# Patient Record
Sex: Female | Born: 1970
Health system: Southern US, Community
[De-identification: ages and names within clinical notes are randomized; demographics above are authoritative.]

## PROBLEM LIST (undated history)

## (undated) DIAGNOSIS — F32A Depression, unspecified: Secondary | ICD-10-CM

## (undated) DIAGNOSIS — F419 Anxiety disorder, unspecified: Secondary | ICD-10-CM

## (undated) DIAGNOSIS — F329 Major depressive disorder, single episode, unspecified: Secondary | ICD-10-CM

## (undated) DIAGNOSIS — R5382 Chronic fatigue, unspecified: Secondary | ICD-10-CM

## (undated) DIAGNOSIS — J3089 Other allergic rhinitis: Secondary | ICD-10-CM

## (undated) DIAGNOSIS — D573 Sickle-cell trait: Secondary | ICD-10-CM

## (undated) DIAGNOSIS — E162 Hypoglycemia, unspecified: Secondary | ICD-10-CM

## (undated) DIAGNOSIS — J45909 Unspecified asthma, uncomplicated: Secondary | ICD-10-CM

## (undated) DIAGNOSIS — J449 Chronic obstructive pulmonary disease, unspecified: Secondary | ICD-10-CM

## (undated) DIAGNOSIS — D649 Anemia, unspecified: Secondary | ICD-10-CM

## (undated) DIAGNOSIS — M1990 Unspecified osteoarthritis, unspecified site: Secondary | ICD-10-CM

## (undated) HISTORY — PX: SHOULDER ARTHROSCOPY: SHX128

## (undated) HISTORY — PX: COLONOSCOPY: SHX174

## (undated) HISTORY — PX: KNEE ARTHROSCOPY: SUR90

## (undated) HISTORY — PX: WRIST GANGLION EXCISION: SUR520

## (undated) HISTORY — PX: ORTHOPEDIC SURGERY: SHX850

---
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
## 1992-07-12 HISTORY — PX: TUBAL LIGATION: SHX77

## 2000-02-17 HISTORY — PX: CHOLECYSTECTOMY: SHX55

## 2013-07-12 HISTORY — PX: ABDOMINAL HYSTERECTOMY: SHX81

## 2017-12-22 DIAGNOSIS — S93692A Other sprain of left foot, initial encounter: Secondary | ICD-10-CM | POA: Diagnosis not present

## 2018-05-10 ENCOUNTER — Ambulatory Visit: Payer: Self-pay | Admitting: Family Medicine

## 2018-06-01 ENCOUNTER — Emergency Department (HOSPITAL_COMMUNITY): Payer: Managed Care, Other (non HMO)

## 2018-06-01 ENCOUNTER — Emergency Department (HOSPITAL_COMMUNITY)
Admission: EM | Admit: 2018-06-01 | Discharge: 2018-06-02 | Disposition: A | Discharge status: Home / Self Care | Payer: Managed Care, Other (non HMO) | Attending: Emergency Medicine | Admitting: Emergency Medicine

## 2018-06-01 DIAGNOSIS — S4992XA Unspecified injury of left shoulder and upper arm, initial encounter: Secondary | ICD-10-CM | POA: Diagnosis present

## 2018-06-01 DIAGNOSIS — Y929 Unspecified place or not applicable: Secondary | ICD-10-CM | POA: Diagnosis not present

## 2018-06-01 DIAGNOSIS — Y9389 Activity, other specified: Secondary | ICD-10-CM | POA: Insufficient documentation

## 2018-06-01 DIAGNOSIS — S80212A Abrasion, left knee, initial encounter: Secondary | ICD-10-CM | POA: Diagnosis not present

## 2018-06-01 DIAGNOSIS — S43102A Unspecified dislocation of left acromioclavicular joint, initial encounter: Secondary | ICD-10-CM | POA: Diagnosis not present

## 2018-06-01 DIAGNOSIS — S43402A Unspecified sprain of left shoulder joint, initial encounter: Secondary | ICD-10-CM | POA: Diagnosis not present

## 2018-06-01 DIAGNOSIS — Z79899 Other long term (current) drug therapy: Secondary | ICD-10-CM | POA: Insufficient documentation

## 2018-06-01 DIAGNOSIS — Y999 Unspecified external cause status: Secondary | ICD-10-CM | POA: Insufficient documentation

## 2018-06-01 DIAGNOSIS — S8992XA Unspecified injury of left lower leg, initial encounter: Secondary | ICD-10-CM | POA: Diagnosis not present

## 2018-06-01 DIAGNOSIS — T1490XA Injury, unspecified, initial encounter: Secondary | ICD-10-CM

## 2018-06-01 DIAGNOSIS — M549 Dorsalgia, unspecified: Secondary | ICD-10-CM | POA: Diagnosis not present

## 2018-06-01 MED ORDER — FENTANYL CITRATE (PF) 100 MCG/2ML IJ SOLN
50.0000 ug | Freq: Once | INTRAMUSCULAR | Status: AC
  Administered 2018-06-01: 50 ug via INTRAVENOUS

## 2018-06-01 MED ORDER — FENTANYL CITRATE (PF) 100 MCG/2ML IJ SOLN
INTRAMUSCULAR | Status: AC
  Filled 2018-06-01: qty 2

## 2018-06-01 NOTE — ED Notes (Signed)
Patient transported to CT 
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
## 2018-06-01 NOTE — Progress Notes (Signed)
Orthopedic Tech Progress Note Patient Details:  Chelsea Morse 10-Feb-1971 [id number]  Ortho Devices Type of Ortho Device: Arm sling Ortho Device/Splint Interventions: Ordered, Application   Post Interventions Patient Tolerated: Well Instructions Provided: Care of device, Adjustment of device   [name] T 06/01/2018, 11:54 PM
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
## 2018-06-01 NOTE — ED Notes (Signed)
Xray bedside.
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
## 2018-06-02 NOTE — ED Notes (Signed)
Provider back in room to discuss discharge questions.

## 2018-06-02 NOTE — ED Notes (Signed)
Discharge instructions reviewed by both RN and provider.  All questions answered, no other concerns at this time.  Pt stated "I can't sign." referring to discharge instructions.  Wanted to walk rather than ride in wheelchair out to car.

## 2018-06-16 ENCOUNTER — Encounter (HOSPITAL_COMMUNITY): Payer: Self-pay

## 2018-06-16 ENCOUNTER — Ambulatory Visit (HOSPITAL_COMMUNITY)
Admission: EM | Admit: 2018-06-16 | Discharge: 2018-06-16 | Disposition: A | Discharge status: Home / Self Care | Payer: Managed Care, Other (non HMO) | Attending: Family Medicine | Admitting: Family Medicine

## 2018-06-16 DIAGNOSIS — S43005A Unspecified dislocation of left shoulder joint, initial encounter: Secondary | ICD-10-CM | POA: Diagnosis not present

## 2018-06-16 HISTORY — DX: Hypoglycemia, unspecified: E16.2

## 2018-06-16 HISTORY — DX: Chronic obstructive pulmonary disease, unspecified: J44.9

## 2018-06-16 MED ORDER — OXYCODONE-ACETAMINOPHEN 5-325 MG PO TABS: 1.0000 | ORAL_TABLET | Freq: Every evening | ORAL | 0 refills | Status: DC | PRN
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
## 2018-06-16 NOTE — Discharge Instructions (Signed)
Follow up with [facility]

## 2018-07-28 ENCOUNTER — Encounter (HOSPITAL_COMMUNITY): Payer: Self-pay | Admitting: *Deleted

## 2018-07-28 ENCOUNTER — Emergency Department (HOSPITAL_COMMUNITY)
Admission: EM | Admit: 2018-07-28 | Discharge: 2018-07-28 | Disposition: A | Discharge status: Home / Self Care | Payer: Medicare Other | Attending: Emergency Medicine | Admitting: Emergency Medicine

## 2018-07-28 ENCOUNTER — Emergency Department (HOSPITAL_COMMUNITY): Payer: Medicare Other

## 2018-07-28 DIAGNOSIS — B9789 Other viral agents as the cause of diseases classified elsewhere: Secondary | ICD-10-CM | POA: Diagnosis not present

## 2018-07-28 DIAGNOSIS — J449 Chronic obstructive pulmonary disease, unspecified: Secondary | ICD-10-CM | POA: Diagnosis not present

## 2018-07-28 DIAGNOSIS — J069 Acute upper respiratory infection, unspecified: Secondary | ICD-10-CM | POA: Diagnosis not present

## 2018-07-28 DIAGNOSIS — Z79899 Other long term (current) drug therapy: Secondary | ICD-10-CM | POA: Diagnosis not present

## 2018-07-28 DIAGNOSIS — F17200 Nicotine dependence, unspecified, uncomplicated: Secondary | ICD-10-CM | POA: Diagnosis not present

## 2018-07-28 DIAGNOSIS — R05 Cough: Secondary | ICD-10-CM | POA: Diagnosis not present

## 2018-07-28 MED ORDER — OSELTAMIVIR PHOSPHATE 75 MG PO CAPS: 75.0000 mg | ORAL_CAPSULE | Freq: Two times a day (BID) | ORAL | 0 refills | Status: DC

## 2018-07-28 NOTE — ED Triage Notes (Signed)
Chest congestion for 3 days with soreness in chest and back when taking a deep breath.  Pt states that she has had a cough and would like a CXR to see if she has pneumonia

## 2018-07-28 NOTE — ED Notes (Signed)
Patient given discharge instructions and verbalized understanding.  Patient stable to discharge at this time.  Patient is alert and oriented to baseline.  No distressed noted at this time.  All belongings taken with the patient at discharge.   

## 2018-07-28 NOTE — Discharge Instructions (Addendum)
Return for any problem.  Follow-up with your regular care providers as instructed. 

## 2018-07-28 NOTE — ED Provider Notes (Signed)
[facility] EMERGENCY DEPARTMENT Provider Note   CSN: [id number] Arrival date & time: 07/28/18  1247     History   Chief Complaint Chief Complaint  Patient presents with  . URI    HPI Chelsea Morse is a 48 y.o. female.  48 year old female with prior medical history as detailed below presents for evaluation of cough, congestion, myalgia, subjective fever.  Symptoms started 4 days ago.  Patient requested an x-ray to rule out possible pneumonia.  Patient was no other acute complaint.  Patient currently denies shortness of breath, vomiting, or other acute complaint.  The history is provided by the patient and medical records.  URI  Presenting symptoms: congestion and cough   Severity:  Mild Onset quality:  Sudden Duration:  4 days Timing:  Constant Progression:  Waxing and waning Chronicity:  New Relieved by:  Nothing Worsened by:  Nothing Ineffective treatments:  None tried   Past Medical History:  Diagnosis Date  . COPD (chronic obstructive pulmonary disease) ([place])   . Hypoglycemia     There are no active problems to display for this patient.   Past Surgical History:  Procedure Laterality Date  . ORTHOPEDIC SURGERY       OB History   No obstetric history on file.      Home Medications    Prior to Admission medications   Medication Sig Start Date End Date Taking? Authorizing Provider  albuterol (PROVENTIL) (2.5 MG/3ML) 0.083% nebulizer solution Take 2.5 mg by nebulization every 6 (six) hours as needed for wheezing or shortness of breath.    [provider]  albuterol (VENTOLIN HFA) 108 (90 Base) MCG/ACT inhaler Inhale 2 puffs into the lungs every 4 (four) hours as needed for wheezing or shortness of breath.    [provider]  budesonide-formoterol (SYMBICORT) 160-4.5 MCG/ACT inhaler Inhale 2 puffs into the lungs 2 (two) times daily.    [provider]  fluticasone (FLONASE) 50 MCG/ACT nasal spray Place 1 spray  into both nostrils daily.    [provider]  lamoTRIgine (LAMICTAL) 25 MG tablet Take 25 mg by mouth 2 (two) times daily.     [provider]  Menthol-Camphor (ICY HOT ADVANCED RELIEF) 16-11 % CREA Apply 1 application topically as needed (for back pain).    [provider]  montelukast (SINGULAIR) 10 MG tablet Take 10 mg by mouth at bedtime.    [provider]  naproxen sodium (ALEVE) 220 MG tablet Take 220-440 mg by mouth 2 (two) times daily as needed (for back pain).     [provider]  oseltamivir (TAMIFLU) 75 MG capsule Take 1 capsule (75 mg total) by mouth every 12 (twelve) hours. 07/28/18   Chelsea Merino, MD  oxyCODONE-acetaminophen (PERCOCET/ROXICET) 5-325 MG tablet Take 1 tablet by mouth at bedtime as needed and may repeat dose one time if needed for severe pain. 06/16/18   Robyn Haber, MD  QUEtiapine (SEROQUEL) 50 MG tablet Take 50 mg by mouth at bedtime.    [provider]    Family History Family History  Problem Relation Age of Onset  . Healthy Mother   . Healthy Father     Social History Social History   Tobacco Use  . Smoking status: Current Some Day Smoker  . Smokeless tobacco: Never Used  Substance Use Topics  . Alcohol use: Never    Frequency: Never  . Drug use: Never     Allergies   Asa [aspirin]   Review  of Systems Review of Systems  HENT: Positive for congestion.   Respiratory: Positive for cough.   All other systems reviewed and are negative.    Physical Exam Updated Vital Signs BP (!) 146/98 (BP Location: Right Arm)   Pulse 72   Temp 98.5 F (36.9 C) (Oral)   Resp 16   Ht 5\' 7"  (1.702 m)   Wt 65.8 kg   SpO2 100%   BMI 22.71 kg/m   Physical Exam Vitals signs and nursing note reviewed.  Constitutional:      General: She is not in acute distress.    Appearance: She is well-developed.  HENT:     Head: Normocephalic and atraumatic.  Eyes:     Conjunctiva/sclera: Conjunctivae  normal.     Pupils: Pupils are equal, round, and reactive to light.  Neck:     Musculoskeletal: Normal range of motion and neck supple.  Cardiovascular:     Rate and Rhythm: Normal rate and regular rhythm.     Heart sounds: Normal heart sounds.  Pulmonary:     Effort: Pulmonary effort is normal. No respiratory distress.     Breath sounds: Normal breath sounds.     Comments: Lungs clear bilaterally with auscultation.  No appreciable wheezes. Abdominal:     General: There is no distension.     Palpations: Abdomen is soft.     Tenderness: There is no abdominal tenderness.  Musculoskeletal: Normal range of motion.        General: No deformity.  Skin:    General: Skin is warm and dry.  Neurological:     Mental Status: She is alert and oriented to person, place, and time.      ED Treatments / Results  Labs (all labs ordered are listed, but only abnormal results are displayed) Labs Reviewed - No data to display  EKG None  Radiology Dg Chest 2 View  Result Date: 07/28/2018 CLINICAL DATA:  Cough and congestion EXAM: CHEST - 2 VIEW COMPARISON:  June 01, 2018 FINDINGS: Lungs are clear. Heart size and pulmonary vascularity are normal. No adenopathy. No bone lesions. IMPRESSION: No edema or consolidation. Electronically Signed   By: Lowella Grip III M.D.   On: 07/28/2018 13:13    Procedures Procedures (including critical care time)  Medications Ordered in ED Medications - No data to display   Initial Impression / Assessment and Plan / ED Course  I have reviewed the triage vital signs and the nursing notes.  Pertinent labs & imaging results that were available during my care of the patient were reviewed by me and considered in my medical decision making (see chart for details).     MDM  Screen complete  Patient is presenting for evaluation of URI symptoms.  This is most likely viral.  Patient with comorbidities including COPD.  Chest x-ray does not reveal acute  infiltrate or pneumonia.  Patient without evidence of current bronchospasm.  Patient offered Tamiflu given her comorbidities and the high likelihood of influenza infection.  She is doubtful that she will fill the prescription.  Importance of close follow-up was stressed.  Strict return cautions given and understood.  Final Clinical Impressions(s) / ED Diagnoses   Final diagnoses:  Viral upper respiratory tract infection    ED Discharge Orders         Ordered    oseltamivir (TAMIFLU) 75 MG capsule  Every 12 hours     07/28/18 1347  Chelsea Merino, MD 07/28/18 1353

## 2018-08-12 ENCOUNTER — Emergency Department: Payer: Medicare Other

## 2018-08-12 ENCOUNTER — Emergency Department
Admission: EM | Admit: 2018-08-12 | Discharge: 2018-08-12 | Disposition: A | Discharge status: Home / Self Care | Payer: Medicare Other | Attending: Emergency Medicine | Admitting: Emergency Medicine

## 2018-08-12 ENCOUNTER — Other Ambulatory Visit: Payer: Self-pay

## 2018-08-12 ENCOUNTER — Encounter: Payer: Self-pay | Admitting: Emergency Medicine

## 2018-08-12 DIAGNOSIS — K5900 Constipation, unspecified: Secondary | ICD-10-CM | POA: Insufficient documentation

## 2018-08-12 DIAGNOSIS — F17200 Nicotine dependence, unspecified, uncomplicated: Secondary | ICD-10-CM | POA: Diagnosis not present

## 2018-08-12 DIAGNOSIS — R110 Nausea: Secondary | ICD-10-CM | POA: Insufficient documentation

## 2018-08-12 DIAGNOSIS — N390 Urinary tract infection, site not specified: Secondary | ICD-10-CM | POA: Diagnosis not present

## 2018-08-12 DIAGNOSIS — Z79899 Other long term (current) drug therapy: Secondary | ICD-10-CM | POA: Insufficient documentation

## 2018-08-12 DIAGNOSIS — R1012 Left upper quadrant pain: Secondary | ICD-10-CM | POA: Insufficient documentation

## 2018-08-12 DIAGNOSIS — J449 Chronic obstructive pulmonary disease, unspecified: Secondary | ICD-10-CM | POA: Insufficient documentation

## 2018-08-12 LAB — CBC
HEMATOCRIT: 37.9 % (ref 36.0–46.0)
HEMOGLOBIN: 13.1 g/dL (ref 12.0–15.0)
MCH: 26.8 pg (ref 26.0–34.0)
MCHC: 34.6 g/dL (ref 30.0–36.0)
MCV: 77.5 fL — AB (ref 80.0–100.0)
Platelets: 333 10*3/uL (ref 150–400)
RBC: 4.89 MIL/uL (ref 3.87–5.11)
RDW: 13 % (ref 11.5–15.5)
WBC: 9 10*3/uL (ref 4.0–10.5)
nRBC: 0 % (ref 0.0–0.2)

## 2018-08-12 LAB — URINALYSIS, COMPLETE (UACMP) WITH MICROSCOPIC
Bilirubin Urine: NEGATIVE
Glucose, UA: NEGATIVE mg/dL
Ketones, ur: NEGATIVE mg/dL
Leukocytes, UA: NEGATIVE
Nitrite: POSITIVE — AB
PROTEIN: NEGATIVE mg/dL
SPECIFIC GRAVITY, URINE: 1.013 (ref 1.005–1.030)
pH: 7 (ref 5.0–8.0)

## 2018-08-12 LAB — COMPREHENSIVE METABOLIC PANEL
ALBUMIN: 4.6 g/dL (ref 3.5–5.0)
ALT: 12 U/L (ref 0–44)
AST: 16 U/L (ref 15–41)
Alkaline Phosphatase: 69 U/L (ref 38–126)
Anion gap: 6 (ref 5–15)
BILIRUBIN TOTAL: 0.7 mg/dL (ref 0.3–1.2)
BUN: 8 mg/dL (ref 6–20)
CHLORIDE: 105 mmol/L (ref 98–111)
CO2: 26 mmol/L (ref 22–32)
CREATININE: 0.63 mg/dL (ref 0.44–1.00)
Calcium: 9.2 mg/dL (ref 8.9–10.3)
GFR calc Af Amer: >60 mL/min (ref >60)
GFR calc non Af Amer: >60 mL/min (ref >60)
GLUCOSE: 89 mg/dL (ref 70–99)
POTASSIUM: 4.4 mmol/L (ref 3.5–5.1)
Sodium: 137 mmol/L (ref 135–145)
Total Protein: 7.9 g/dL (ref 6.5–8.1)

## 2018-08-12 LAB — LIPASE, BLOOD: LIPASE: 20 U/L (ref 11–51)

## 2018-08-12 MED ORDER — PANTOPRAZOLE SODIUM 40 MG PO TBEC: 40.0000 mg | DELAYED_RELEASE_TABLET | Freq: Every day | ORAL | 0 refills | Status: DC

## 2018-08-12 MED ORDER — OXYCODONE-ACETAMINOPHEN 5-325 MG PO TABS: 1.0000 | ORAL_TABLET | Freq: Four times a day (QID) | ORAL | 0 refills | Status: DC | PRN

## 2018-08-12 MED ORDER — ONDANSETRON HCL 4 MG/2ML IJ SOLN
4.0000 mg | Freq: Once | INTRAMUSCULAR | Status: AC
  Administered 2018-08-12: 4 mg via INTRAVENOUS
  Filled 2018-08-12: qty 2

## 2018-08-12 MED ORDER — MORPHINE SULFATE (PF) 4 MG/ML IV SOLN
4.0000 mg | Freq: Once | INTRAVENOUS | Status: AC
  Administered 2018-08-12: 4 mg via INTRAVENOUS
  Filled 2018-08-12: qty 1

## 2018-08-12 MED ORDER — SODIUM CHLORIDE 0.9% FLUSH: 3.0000 mL | Freq: Once | INTRAVENOUS | Status: DC

## 2018-08-12 MED ORDER — SODIUM CHLORIDE 0.9 % IV BOLUS
1000.0000 mL | Freq: Once | INTRAVENOUS | Status: AC
  Administered 2018-08-12: 1000 mL via INTRAVENOUS

## 2018-08-12 MED ORDER — CEPHALEXIN 500 MG PO CAPS: 500.0000 mg | ORAL_CAPSULE | Freq: Two times a day (BID) | ORAL | 0 refills | Status: AC

## 2018-08-12 MED ORDER — METOCLOPRAMIDE HCL 10 MG PO TABS: 10.0000 mg | ORAL_TABLET | Freq: Three times a day (TID) | ORAL | 0 refills | Status: DC | PRN

## 2018-08-12 NOTE — ED Notes (Signed)
Warm blanket given to pt

## 2018-08-12 NOTE — Discharge Instructions (Signed)
Take the Protonix as prescribed daily for the next 2 weeks.  You should also take the antibiotic (cephalexin) as prescribed for 10 days and finish the full course.  You can use the Percocet if needed for pain and the Reglan for nausea.  Return to the ER for new or worsening abdominal pain, flank pain, fevers, vomiting, inability to take the medication, or any other new or worsening symptoms that concern you.

## 2018-08-12 NOTE — ED Provider Notes (Signed)
[facility] Emergency Department Provider Note ____________________________________________   First MD Initiated Contact with Patient 08/12/18 1732     (approximate)  I have reviewed the triage vital signs and the nursing notes.   HISTORY  Chief Complaint Abdominal Pain    HPI Chelsea Morse is a 48 y.o. female with PMH as noted below who presents with left upper quadrant abdominal pain over the last 3 days, persistent course, worse both when she is hungry and after she eats, and sometimes radiating to her left flank.  It is associated with nausea but no vomiting.  It started after she had a glass of wine the night previously.  However, she states that while it had been a few weeks since she had wine this is not unusual for her.  She also reports some constipation and straining to use the bathroom, as well as intermittent dark urine.  She denies fevers or chills.  No prior history of this pain.   Past Medical History:  Diagnosis Date  . COPD (chronic obstructive pulmonary disease) ([place])   . Hypoglycemia     There are no active problems to display for this patient.   Past Surgical History:  Procedure Laterality Date  . ORTHOPEDIC SURGERY      Prior to Admission medications   Medication Sig Start Date End Date Taking? Authorizing Provider  albuterol (PROVENTIL) (2.5 MG/3ML) 0.083% nebulizer solution Take 2.5 mg by nebulization every 6 (six) hours as needed for wheezing or shortness of breath.    [provider]  albuterol (VENTOLIN HFA) 108 (90 Base) MCG/ACT inhaler Inhale 2 puffs into the lungs every 4 (four) hours as needed for wheezing or shortness of breath.    [provider]  budesonide-formoterol (SYMBICORT) 160-4.5 MCG/ACT inhaler Inhale 2 puffs into the lungs 2 (two) times daily.    [provider]  cephALEXin (KEFLEX) 500 MG capsule Take 1 capsule (500 mg total) by mouth 2 (two) times daily for 10 days. 08/12/18  08/22/18  Arta Silence, MD  fluticasone (FLONASE) 50 MCG/ACT nasal spray Place 1 spray into both nostrils daily.    [provider]  lamoTRIgine (LAMICTAL) 25 MG tablet Take 25 mg by mouth 2 (two) times daily.     [provider]  Menthol-Camphor (ICY HOT ADVANCED RELIEF) 16-11 % CREA Apply 1 application topically as needed (for back pain).    [provider]  metoCLOPramide (REGLAN) 10 MG tablet Take 1 tablet (10 mg total) by mouth every 8 (eight) hours as needed for up to 5 days for nausea or vomiting. 08/12/18 08/17/18  Arta Silence, MD  montelukast (SINGULAIR) 10 MG tablet Take 10 mg by mouth at bedtime.    [provider]  naproxen sodium (ALEVE) 220 MG tablet Take 220-440 mg by mouth 2 (two) times daily as needed (for back pain).     [provider]  oseltamivir (TAMIFLU) 75 MG capsule Take 1 capsule (75 mg total) by mouth every 12 (twelve) hours. 07/28/18   [name], MD  oxyCODONE-acetaminophen (PERCOCET) 5-325 MG tablet Take 1 tablet by mouth every 6 (six) hours as needed for severe pain. 08/12/18   Arta Silence, MD  pantoprazole (PROTONIX) 40 MG tablet Take 1 tablet (40 mg total) by mouth daily for 15 days. 08/12/18 08/27/18  Arta Silence, MD  QUEtiapine (SEROQUEL) 50 MG tablet Take 50 mg by mouth at bedtime.    [provider]    Allergies Diona Fanti [aspirin]  Family History  Problem Relation Age of Onset  . Healthy Mother   . Healthy Father     Social History Social History   Tobacco Use  . Smoking status: Current Some Day Smoker  . Smokeless tobacco: Never Used  Substance Use Topics  . Alcohol use: Never    Frequency: Never  . Drug use: Never    Review of Systems  Constitutional: No fever. Eyes: No redness. ENT: No sore throat. Cardiovascular: Denies chest pain. Respiratory: Denies shortness of breath. Gastrointestinal: Positive for nausea.  Genitourinary: Negative for dysuria.  Positive for  dark urine. Musculoskeletal: Negative for back pain. Skin: Negative for rash. Neurological: Negative for headache.   ____________________________________________   PHYSICAL EXAM:  VITAL SIGNS: ED Triage Vitals  Enc Vitals Group     BP 08/12/18 1425 129/79     Pulse Rate 08/12/18 1425 62     Resp 08/12/18 1425 18     Temp 08/12/18 1425 98.4 F (36.9 C)     Temp Source 08/12/18 1425 Oral     SpO2 08/12/18 1425 100 %     Weight 08/12/18 1426 140 lb (63.5 kg)     Height 08/12/18 1426 5\' 6"  (1.676 m)     Head Circumference --      Peak Flow --      Pain Score 08/12/18 1426 10     Pain Loc --      Pain Edu? --      Excl. in [place]? --     Constitutional: Alert and oriented.  Relatively well appearing and in no acute distress. Eyes: Conjunctivae are normal.  No scleral icterus. Head: Atraumatic. Nose: No congestion/rhinnorhea. Mouth/Throat: Mucous membranes are moist.   Neck: Normal range of motion.  Cardiovascular: Good peripheral circulation. Respiratory: Normal respiratory effort.  Gastrointestinal: Soft with mild left upper quadrant and moderate left flank tenderness. No distention.  Genitourinary: No CVA tenderness. Musculoskeletal: Extremities warm and well perfused.  Neurologic:  Normal speech and language. No gross focal neurologic deficits are appreciated.  Skin:  Skin is warm and dry. No rash noted. Psychiatric: Mood and affect are normal. Speech and behavior are normal.  ____________________________________________   LABS (all labs ordered are listed, but only abnormal results are displayed)  Labs Reviewed  CBC - Abnormal; Notable for the following components:      Result Value   MCV 77.5 (*)    All other components within normal limits  URINALYSIS, COMPLETE (UACMP) WITH MICROSCOPIC - Abnormal; Notable for the following components:   Color, Urine YELLOW (*)    APPearance CLEAR (*)    Hgb urine dipstick MODERATE (*)    Nitrite POSITIVE (*)    Bacteria, UA  RARE (*)    All other components within normal limits  LIPASE, BLOOD  COMPREHENSIVE METABOLIC PANEL  POC URINE PREG, ED   ____________________________________________  EKG  ED ECG REPORT I, Arta Silence, the attending physician, personally viewed and interpreted this ECG.  Date: 08/12/2018 EKG Time: 1437 Rate: 57 Rhythm: normal sinus rhythm QRS Axis: normal Intervals: normal ST/T Wave abnormalities: normal Narrative Interpretation: no evidence of acute ischemia  ____________________________________________  RADIOLOGY  CT abdomen: No ureteral stone or other acute abnormality.  Left ovarian cyst.  ____________________________________________   PROCEDURES  Procedure(s) performed: No  Procedures  Critical Care performed: No ____________________________________________   INITIAL IMPRESSION / ASSESSMENT AND PLAN / ED COURSE  Pertinent labs & imaging results that were available during my care of the patient were reviewed by me and  considered in my medical decision making (see chart for details).  48 year old female with PMH as noted above presents with left upper quadrant abdominal pain over the last few days with nausea but no vomiting, as well as with possible urinary symptoms.  On exam the patient is overall well-appearing and her vital signs are normal.  She is tender in the left upper quadrant and towards the left flank but with no peritoneal signs.  The remainder of the exam is relatively unremarkable.  Differential primarily includes gastritis or PUD, however ureteral stone, pyelonephritis, or enteritis are also on the differential.  We will obtain labs, UA, CT, and reassess.  ----------------------------------------- 8:49 PM on 08/12/2018 -----------------------------------------  The lab work-up was unremarkable.  CT shows no significant acute findings.  UA is positive for nitrates and RBCs.  Although she does not have significant WBCs and this is a  questionable finding for UTI, given that the patient did describe dark and malodorous urine and has had several UTIs in the past, it is consistent with possible UTI and likely could be contributing to the patient's pain so I will treat her for it.  I do still think that gastritis is contributing to the patient's symptoms.  I will start her on a PPI, give a course of Keflex for the UTI, and give Reglan and a small quantity of Percocet for symptomatic control.  I discussed the results of the work-up with the patient.  She feels comfortable to go home.  She states that she is seeking a primary care doctor to follow-up with once her new insurance kicks in.  Return precautions given, and she expresses understanding. ____________________________________________   FINAL CLINICAL IMPRESSION(S) / ED DIAGNOSES  Final diagnoses:  Left upper quadrant pain  Urinary tract infection without hematuria, site unspecified      NEW MEDICATIONS STARTED DURING THIS VISIT:  Discharge Medication List as of 08/12/2018  8:25 PM    START taking these medications   Details  cephALEXin (KEFLEX) 500 MG capsule Take 1 capsule (500 mg total) by mouth 2 (two) times daily for 10 days., Starting Sat 08/12/2018, Until Tue 08/22/2018, Normal    metoCLOPramide (REGLAN) 10 MG tablet Take 1 tablet (10 mg total) by mouth every 8 (eight) hours as needed for up to 5 days for nausea or vomiting., Starting Sat 08/12/2018, Until Thu 08/17/2018, Normal    pantoprazole (PROTONIX) 40 MG tablet Take 1 tablet (40 mg total) by mouth daily for 15 days., Starting Sat 08/12/2018, Until Sun 08/27/2018, Normal         Note:  This document was prepared using Dragon voice recognition software and may include unintentional dictation errors.    Arta Silence, MD 08/12/18 2051

## 2018-08-12 NOTE — ED Triage Notes (Signed)
Pt to ED via POV. Pt states that she is having LUQ abd pain x 2 day. Pt states that 3 nights ago she had 1 glass of wine. Pt woke up with pain in the LUQ, pt states that the pain felt like she needed to eat. Pt states that the pain has continued to get worse. Pt denies vomiting but states that she has had nausea and dry heaving and some diarrhea. Pt is in NAD at this time.

## 2018-08-25 ENCOUNTER — Emergency Department
Admission: EM | Admit: 2018-08-25 | Discharge: 2018-08-25 | Disposition: A | Discharge status: Home / Self Care | Payer: Medicare Other | Attending: Emergency Medicine | Admitting: Emergency Medicine

## 2018-08-25 ENCOUNTER — Other Ambulatory Visit: Payer: Self-pay

## 2018-08-25 DIAGNOSIS — R1084 Generalized abdominal pain: Secondary | ICD-10-CM | POA: Diagnosis not present

## 2018-08-25 DIAGNOSIS — J449 Chronic obstructive pulmonary disease, unspecified: Secondary | ICD-10-CM | POA: Insufficient documentation

## 2018-08-25 DIAGNOSIS — R319 Hematuria, unspecified: Secondary | ICD-10-CM

## 2018-08-25 DIAGNOSIS — F17210 Nicotine dependence, cigarettes, uncomplicated: Secondary | ICD-10-CM | POA: Insufficient documentation

## 2018-08-25 DIAGNOSIS — R52 Pain, unspecified: Secondary | ICD-10-CM | POA: Diagnosis not present

## 2018-08-25 DIAGNOSIS — R109 Unspecified abdominal pain: Secondary | ICD-10-CM

## 2018-08-25 DIAGNOSIS — K2970 Gastritis, unspecified, without bleeding: Secondary | ICD-10-CM | POA: Diagnosis not present

## 2018-08-25 DIAGNOSIS — R231 Pallor: Secondary | ICD-10-CM | POA: Diagnosis not present

## 2018-08-25 DIAGNOSIS — R1032 Left lower quadrant pain: Secondary | ICD-10-CM | POA: Diagnosis not present

## 2018-08-25 LAB — CBC
HCT: 37.9 % (ref 36.0–46.0)
Hemoglobin: 13.2 g/dL (ref 12.0–15.0)
MCH: 26.9 pg (ref 26.0–34.0)
MCHC: 34.8 g/dL (ref 30.0–36.0)
MCV: 77.3 fL — ABNORMAL LOW (ref 80.0–100.0)
Platelets: 233 10*3/uL (ref 150–400)
RBC: 4.9 MIL/uL (ref 3.87–5.11)
RDW: 13.4 % (ref 11.5–15.5)
WBC: 9.9 10*3/uL (ref 4.0–10.5)
nRBC: 0 % (ref 0.0–0.2)

## 2018-08-25 LAB — COMPREHENSIVE METABOLIC PANEL
ALK PHOS: 58 U/L (ref 38–126)
ALT: 45 U/L — ABNORMAL HIGH (ref 0–44)
AST: 39 U/L (ref 15–41)
Albumin: 4.4 g/dL (ref 3.5–5.0)
Anion gap: 7 (ref 5–15)
BUN: 9 mg/dL (ref 6–20)
CALCIUM: 9 mg/dL (ref 8.9–10.3)
CO2: 24 mmol/L (ref 22–32)
Chloride: 105 mmol/L (ref 98–111)
Creatinine, Ser: 0.67 mg/dL (ref 0.44–1.00)
GFR calc Af Amer: >60 mL/min (ref >60)
GFR calc non Af Amer: >60 mL/min (ref >60)
Glucose, Bld: 92 mg/dL (ref 70–99)
Potassium: 3.3 mmol/L — ABNORMAL LOW (ref 3.5–5.1)
Sodium: 136 mmol/L (ref 135–145)
Total Bilirubin: 1.2 mg/dL (ref 0.3–1.2)
Total Protein: 7.9 g/dL (ref 6.5–8.1)

## 2018-08-25 LAB — URINALYSIS, COMPLETE (UACMP) WITH MICROSCOPIC
Bacteria, UA: NONE SEEN
Bilirubin Urine: NEGATIVE
Glucose, UA: NEGATIVE mg/dL
Ketones, ur: 20 mg/dL — AB
Leukocytes,Ua: NEGATIVE
Nitrite: NEGATIVE
Protein, ur: NEGATIVE mg/dL
Specific Gravity, Urine: 1.017 (ref 1.005–1.030)
pH: 7 (ref 5.0–8.0)

## 2018-08-25 MED ORDER — HYDROMORPHONE HCL 1 MG/ML IJ SOLN
1.0000 mg | INTRAMUSCULAR | Status: DC | PRN
  Administered 2018-08-25: 1 mg via INTRAVENOUS
  Filled 2018-08-25: qty 1

## 2018-08-25 MED ORDER — SUCRALFATE 1 G PO TABS: 1.0000 g | ORAL_TABLET | Freq: Four times a day (QID) | ORAL | 0 refills | Status: DC

## 2018-08-25 MED ORDER — ONDANSETRON 4 MG PO TBDP: 4.0000 mg | ORAL_TABLET | Freq: Once | ORAL | Status: DC

## 2018-08-25 MED ORDER — HALOPERIDOL LACTATE 5 MG/ML IJ SOLN
5.0000 mg | Freq: Once | INTRAMUSCULAR | Status: AC
  Administered 2018-08-25: 5 mg via INTRAVENOUS
  Filled 2018-08-25 (×2): qty 1

## 2018-08-25 MED ORDER — DICYCLOMINE HCL 20 MG PO TABS: 20.0000 mg | ORAL_TABLET | Freq: Three times a day (TID) | ORAL | 0 refills | Status: DC | PRN

## 2018-08-25 MED ORDER — SODIUM CHLORIDE 0.9 % IV SOLN
Freq: Once | INTRAVENOUS | Status: AC
  Administered 2018-08-25: 18:00:00 via INTRAVENOUS

## 2018-08-25 NOTE — ED Notes (Addendum)
Pt c/o pain and nausea requesting medication - Dr Archie Balboa VO zofran and toradol - pt is allergic to ASA so has warning against toradol - Dr Archie Balboa notified and will go and assess pt

## 2018-08-25 NOTE — ED Notes (Signed)
Pt was asleep and had to be aroused to ambulate to room Pt c/o left flank pain since Feb 1st - she was seen here for this pain and dx with UTI and "gastric problem" - she has had decreased intake - she has been referred to GI and not made appt - Thursday night she started with vomiting and diarrhea (from drinking spoiled milk) - pain started intensely at noon today (vomited x2 and loose stool x1)

## 2018-08-25 NOTE — Discharge Instructions (Addendum)
Please seek medical attention for any high fevers, chest pain, shortness of breath, change in behavior, persistent vomiting, bloody stool or any other new or concerning symptoms.  

## 2018-08-25 NOTE — ED Triage Notes (Signed)
Pt comes into the ED via EMS from work with c/o left flank and lower abd pain  For 2 weeks, was seen here on 2/1 dx with UTI states she is still taking the abx. Pt c/o N/V in the past couple days. EMS gave 4mg  Zofran IV in route.

## 2018-08-25 NOTE — ED Provider Notes (Signed)
[facility] Emergency Department Provider Note  ____________________________________________   I have reviewed the triage vital signs and the nursing notes.   HISTORY  Chief Complaint Flank Pain   History limited by: Not Limited   HPI Chelsea Morse is a 48 y.o. female who presents to the emergency department today because of concerns for left sided abdominal pain.  The pain will start in the flank and then radiate down with her stomach.  She states it also goes between her legs and to her back.  Patient was seen for similar pain couple weeks ago.  She did take the medications as prescribed without any significant relief.  Pain got worse again the past couple of days.  The patient states that she has had nausea with this.  She has had vomiting.  She denies any bloody vomiting or diarrhea.  She denies any fevers.  Per medical record review patient has a history of CT renal stone study done roughly 2 weeks ago without any acute findings or stones. Persistent enlargement of left ovary.   Past Medical History:  Diagnosis Date  . COPD (chronic obstructive pulmonary disease) ([place])   . Hypoglycemia     There are no active problems to display for this patient.   Past Surgical History:  Procedure Laterality Date  . ORTHOPEDIC SURGERY      Prior to Admission medications   Medication Sig Start Date End Date Taking? Authorizing Provider  albuterol (PROVENTIL) (2.5 MG/3ML) 0.083% nebulizer solution Take 2.5 mg by nebulization every 6 (six) hours as needed for wheezing or shortness of breath.    [provider]  albuterol (VENTOLIN HFA) 108 (90 Base) MCG/ACT inhaler Inhale 2 puffs into the lungs every 4 (four) hours as needed for wheezing or shortness of breath.    [provider]  budesonide-formoterol (SYMBICORT) 160-4.5 MCG/ACT inhaler Inhale 2 puffs into the lungs 2 (two) times daily.    [provider]  fluticasone (FLONASE) 50  MCG/ACT nasal spray Place 1 spray into both nostrils daily.    [provider]  lamoTRIgine (LAMICTAL) 25 MG tablet Take 25 mg by mouth 2 (two) times daily.     [provider]  Menthol-Camphor (ICY HOT ADVANCED RELIEF) 16-11 % CREA Apply 1 application topically as needed (for back pain).    [provider]  metoCLOPramide (REGLAN) 10 MG tablet Take 1 tablet (10 mg total) by mouth every 8 (eight) hours as needed for up to 5 days for nausea or vomiting. 08/12/18 08/17/18  Arta Silence, MD  montelukast (SINGULAIR) 10 MG tablet Take 10 mg by mouth at bedtime.    [provider]  naproxen sodium (ALEVE) 220 MG tablet Take 220-440 mg by mouth 2 (two) times daily as needed (for back pain).     [provider]  oseltamivir (TAMIFLU) 75 MG capsule Take 1 capsule (75 mg total) by mouth every 12 (twelve) hours. 07/28/18   [name], MD  oxyCODONE-acetaminophen (PERCOCET) 5-325 MG tablet Take 1 tablet by mouth every 6 (six) hours as needed for severe pain. 08/12/18   Arta Silence, MD  pantoprazole (PROTONIX) 40 MG tablet Take 1 tablet (40 mg total) by mouth daily for 15 days. 08/12/18 08/27/18  Arta Silence, MD  QUEtiapine (SEROQUEL) 50 MG tablet Take 50 mg by mouth at bedtime.    [provider]    Allergies Asa [aspirin]  Family History  Problem Relation Age of Onset  . Healthy Mother   .  Healthy Father     Social History Social History   Tobacco Use  . Smoking status: Current Some Day Smoker  . Smokeless tobacco: Never Used  Substance Use Topics  . Alcohol use: Never    Frequency: Never  . Drug use: Never    Review of Systems Constitutional: No fever/chills Eyes: No visual changes. ENT: No sore throat. Cardiovascular: Denies chest pain. Respiratory: Denies shortness of breath. Gastrointestinal: Positive for abdominal pain, nausea and vomiting.  Genitourinary: Negative for dysuria. Musculoskeletal: Negative for  back pain. Skin: Negative for rash. Neurological: Negative for headaches, focal weakness or numbness.  ____________________________________________   PHYSICAL EXAM:  VITAL SIGNS: ED Triage Vitals  Enc Vitals Group     BP 08/25/18 1424 114/80     Pulse Rate 08/25/18 1424 (!) 54     Resp 08/25/18 1424 16     Temp 08/25/18 1424 98.3 F (36.8 C)     Temp Source 08/25/18 1424 Oral     SpO2 08/25/18 1424 100 %     Weight 08/25/18 1425 142 lb (64.4 kg)     Height 08/25/18 1425 5\' 6"  (1.676 m)     Head Circumference --      Peak Flow --      Pain Score 08/25/18 1424 10   Constitutional: Alert and oriented.  Eyes: Conjunctivae are normal.  ENT      Head: Normocephalic and atraumatic.      Nose: No congestion/rhinnorhea.      Mouth/Throat: Mucous membranes are moist.      Neck: No stridor. Hematological/Lymphatic/Immunilogical: No cervical lymphadenopathy. Cardiovascular: Normal rate, regular rhythm.  No murmurs, rubs, or gallops. Respiratory: Normal respiratory effort without tachypnea nor retractions. Breath sounds are clear and equal bilaterally. No wheezes/rales/rhonchi. Gastrointestinal: Soft and somewhat diffusely tender to palpation. No CVA tenderness. Genitourinary: Deferred Musculoskeletal: Normal range of motion in all extremities. No lower extremity edema. Neurologic:  Normal speech and language. No gross focal neurologic deficits are appreciated.  Skin:  Skin is warm, dry and intact. No rash noted. Psychiatric: Mood and affect are normal. Speech and behavior are normal. Patient exhibits appropriate insight and judgment.  ____________________________________________    LABS (pertinent positives/negatives)  CMP wnl except k 3.3, alt 45 UA clear, moderate hgb dipstick, 21-50 RBCs CBC wbc 9.9, hgb 13.2, plt 233 ____________________________________________   EKG  None  ____________________________________________     RADIOLOGY  None  ____________________________________________   PROCEDURES  Procedures  ____________________________________________   INITIAL IMPRESSION / ASSESSMENT AND PLAN / ED COURSE  Pertinent labs & imaging results that were available during my care of the patient were reviewed by me and considered in my medical decision making (see chart for details).   Patient presented to the emergency department today because of concerns for left sided abdominal pain.  Patient was seen in emergency department roughly 2 weeks ago for similar symptoms with a negative CT scan at that time.  Today's work-up does show some blood in the urine however negative kidney stone study a at previous visit.  Given that patient describes pain is moving around I do wonder if IBS or similar pathology possible.  Patient was given IV fluids at home.  She did have good relief of her pain.  Will plan on discharging with Bentyl and sucralfate.  Discussed with patient importance of follow-up.   ____________________________________________   FINAL CLINICAL IMPRESSION(S) / ED DIAGNOSES  Final diagnoses:  Left sided abdominal pain  Hematuria, unspecified type  Note: This dictation was prepared with Dragon dictation. Any transcriptional errors that result from this process are unintentional     Nance Pear, MD 08/25/18 [phone]

## 2018-11-28 ENCOUNTER — Ambulatory Visit (HOSPITAL_COMMUNITY)
Admission: EM | Admit: 2018-11-28 | Discharge: 2018-11-28 | Disposition: A | Discharge status: Home / Self Care | Payer: Medicare Other | Attending: Family Medicine | Admitting: Family Medicine

## 2018-11-28 ENCOUNTER — Other Ambulatory Visit: Payer: Self-pay

## 2018-11-28 ENCOUNTER — Encounter (HOSPITAL_COMMUNITY): Payer: Self-pay | Admitting: Family Medicine

## 2018-11-28 DIAGNOSIS — S43085D Other dislocation of left shoulder joint, subsequent encounter: Secondary | ICD-10-CM

## 2018-11-28 NOTE — Discharge Instructions (Signed)
Call Dr. Mardelle Matte tomorrow for an appointment.
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
## 2018-11-28 NOTE — ED Triage Notes (Signed)
Pt here for left shoulder pain that is chronic in nature; pt looking for referral for next step in treatment

## 2018-12-01 DIAGNOSIS — M542 Cervicalgia: Secondary | ICD-10-CM | POA: Diagnosis not present

## 2018-12-01 DIAGNOSIS — S43102A Unspecified dislocation of left acromioclavicular joint, initial encounter: Secondary | ICD-10-CM | POA: Diagnosis not present

## 2018-12-01 DIAGNOSIS — M25562 Pain in left knee: Secondary | ICD-10-CM | POA: Diagnosis not present

## 2018-12-08 DIAGNOSIS — M25562 Pain in left knee: Secondary | ICD-10-CM | POA: Diagnosis not present

## 2018-12-11 DIAGNOSIS — M25562 Pain in left knee: Secondary | ICD-10-CM | POA: Diagnosis not present

## 2018-12-13 DIAGNOSIS — M24812 Other specific joint derangements of left shoulder, not elsewhere classified: Secondary | ICD-10-CM | POA: Diagnosis not present

## 2018-12-13 DIAGNOSIS — M25512 Pain in left shoulder: Secondary | ICD-10-CM | POA: Diagnosis not present

## 2018-12-20 ENCOUNTER — Other Ambulatory Visit: Payer: Self-pay | Admitting: Orthopedic Surgery

## 2018-12-20 ENCOUNTER — Encounter (HOSPITAL_BASED_OUTPATIENT_CLINIC_OR_DEPARTMENT_OTHER): Payer: Self-pay | Admitting: *Deleted

## 2018-12-20 ENCOUNTER — Other Ambulatory Visit: Payer: Self-pay

## 2018-12-21 ENCOUNTER — Other Ambulatory Visit (HOSPITAL_COMMUNITY)
Admission: RE | Admit: 2018-12-21 | Discharge: 2018-12-21 | Disposition: A | Discharge status: Home / Self Care | Payer: Medicare Other | Source: Ambulatory Visit | Attending: Orthopedic Surgery | Admitting: Orthopedic Surgery

## 2018-12-21 DIAGNOSIS — Z1159 Encounter for screening for other viral diseases: Secondary | ICD-10-CM | POA: Insufficient documentation

## 2018-12-22 LAB — NOVEL CORONAVIRUS, NAA (HOSP ORDER, SEND-OUT TO REF LAB; TAT 18-24 HRS): SARS-CoV-2, NAA: NOT DETECTED

## 2018-12-25 ENCOUNTER — Ambulatory Visit (HOSPITAL_COMMUNITY): Payer: Medicare Other

## 2018-12-25 ENCOUNTER — Encounter (HOSPITAL_BASED_OUTPATIENT_CLINIC_OR_DEPARTMENT_OTHER): Payer: Self-pay

## 2018-12-25 ENCOUNTER — Other Ambulatory Visit: Payer: Self-pay

## 2018-12-25 ENCOUNTER — Ambulatory Visit (HOSPITAL_BASED_OUTPATIENT_CLINIC_OR_DEPARTMENT_OTHER): Payer: Medicare Other | Admitting: Anesthesiology

## 2018-12-25 ENCOUNTER — Ambulatory Visit (HOSPITAL_BASED_OUTPATIENT_CLINIC_OR_DEPARTMENT_OTHER)
Admission: RE | Admit: 2018-12-25 | Discharge: 2018-12-25 | Disposition: A | Discharge status: Home / Self Care | Payer: Medicare Other | Attending: Orthopedic Surgery | Admitting: Orthopedic Surgery

## 2018-12-25 ENCOUNTER — Encounter (HOSPITAL_BASED_OUTPATIENT_CLINIC_OR_DEPARTMENT_OTHER)
Admission: RE | Disposition: A | Discharge status: Home / Self Care | Payer: Self-pay | Source: Home / Self Care | Attending: Orthopedic Surgery

## 2018-12-25 DIAGNOSIS — S43102A Unspecified dislocation of left acromioclavicular joint, initial encounter: Secondary | ICD-10-CM | POA: Diagnosis not present

## 2018-12-25 DIAGNOSIS — F329 Major depressive disorder, single episode, unspecified: Secondary | ICD-10-CM | POA: Insufficient documentation

## 2018-12-25 DIAGNOSIS — F4310 Post-traumatic stress disorder, unspecified: Secondary | ICD-10-CM | POA: Diagnosis not present

## 2018-12-25 DIAGNOSIS — Z419 Encounter for procedure for purposes other than remedying health state, unspecified: Secondary | ICD-10-CM

## 2018-12-25 DIAGNOSIS — D573 Sickle-cell trait: Secondary | ICD-10-CM | POA: Insufficient documentation

## 2018-12-25 DIAGNOSIS — F17210 Nicotine dependence, cigarettes, uncomplicated: Secondary | ICD-10-CM | POA: Diagnosis not present

## 2018-12-25 DIAGNOSIS — M25312 Other instability, left shoulder: Secondary | ICD-10-CM | POA: Diagnosis not present

## 2018-12-25 DIAGNOSIS — J45909 Unspecified asthma, uncomplicated: Secondary | ICD-10-CM | POA: Diagnosis not present

## 2018-12-25 DIAGNOSIS — S43082D Other subluxation of left shoulder joint, subsequent encounter: Secondary | ICD-10-CM | POA: Diagnosis not present

## 2018-12-25 DIAGNOSIS — Z79899 Other long term (current) drug therapy: Secondary | ICD-10-CM | POA: Insufficient documentation

## 2018-12-25 DIAGNOSIS — M24412 Recurrent dislocation, left shoulder: Secondary | ICD-10-CM | POA: Insufficient documentation

## 2018-12-25 DIAGNOSIS — G8918 Other acute postprocedural pain: Secondary | ICD-10-CM | POA: Diagnosis not present

## 2018-12-25 HISTORY — DX: Other allergic rhinitis: J30.89

## 2018-12-25 HISTORY — DX: Unspecified osteoarthritis, unspecified site: M19.90

## 2018-12-25 HISTORY — DX: Depression, unspecified: F32.A

## 2018-12-25 HISTORY — DX: Unspecified asthma, uncomplicated: J45.909

## 2018-12-25 HISTORY — DX: Anemia, unspecified: D64.9

## 2018-12-25 HISTORY — DX: Chronic fatigue, unspecified: R53.82

## 2018-12-25 HISTORY — DX: Anxiety disorder, unspecified: F41.9

## 2018-12-25 HISTORY — PX: ACROMIO-CLAVICULAR JOINT REPAIR: SHX5183

## 2018-12-25 HISTORY — DX: Sickle-cell trait: D57.3

## 2018-12-25 SURGERY — REPAIR, ACROMIOCLAVICULAR JOINT
Anesthesia: General | Site: Shoulder | Laterality: Left

## 2018-12-25 MED ORDER — ONDANSETRON HCL 4 MG/2ML IJ SOLN
INTRAMUSCULAR | Status: DC | PRN
  Administered 2018-12-25: 4 mg via INTRAVENOUS

## 2018-12-25 MED ORDER — LACTATED RINGERS IV SOLN
INTRAVENOUS | Status: DC
  Administered 2018-12-25 (×2): via INTRAVENOUS

## 2018-12-25 MED ORDER — PHENYLEPHRINE 40 MCG/ML (10ML) SYRINGE FOR IV PUSH (FOR BLOOD PRESSURE SUPPORT)
PREFILLED_SYRINGE | INTRAVENOUS | Status: AC
  Filled 2018-12-25: qty 10

## 2018-12-25 MED ORDER — OXYCODONE HCL 5 MG/5ML PO SOLN: 5.0000 mg | Freq: Once | ORAL | Status: DC | PRN

## 2018-12-25 MED ORDER — PHENYLEPHRINE 40 MCG/ML (10ML) SYRINGE FOR IV PUSH (FOR BLOOD PRESSURE SUPPORT)
PREFILLED_SYRINGE | INTRAVENOUS | Status: AC
  Filled 2018-12-25: qty 20

## 2018-12-25 MED ORDER — MIDAZOLAM HCL 2 MG/2ML IJ SOLN
INTRAMUSCULAR | Status: AC
  Filled 2018-12-25: qty 2

## 2018-12-25 MED ORDER — MIDAZOLAM HCL 2 MG/2ML IJ SOLN
1.0000 mg | INTRAMUSCULAR | Status: DC | PRN
  Administered 2018-12-25: 2 mg via INTRAVENOUS

## 2018-12-25 MED ORDER — SUCCINYLCHOLINE CHLORIDE 20 MG/ML IJ SOLN
INTRAMUSCULAR | Status: DC | PRN
  Administered 2018-12-25: 100 mg via INTRAVENOUS

## 2018-12-25 MED ORDER — CEFAZOLIN SODIUM-DEXTROSE 2-4 GM/100ML-% IV SOLN
INTRAVENOUS | Status: AC
  Filled 2018-12-25: qty 100

## 2018-12-25 MED ORDER — CEFAZOLIN SODIUM-DEXTROSE 2-4 GM/100ML-% IV SOLN
2.0000 g | INTRAVENOUS | Status: AC
  Administered 2018-12-25: 2 g via INTRAVENOUS

## 2018-12-25 MED ORDER — PROPOFOL 10 MG/ML IV BOLUS
INTRAVENOUS | Status: DC | PRN
  Administered 2018-12-25: 150 mg via INTRAVENOUS

## 2018-12-25 MED ORDER — DEXAMETHASONE SODIUM PHOSPHATE 4 MG/ML IJ SOLN
INTRAMUSCULAR | Status: DC | PRN
  Administered 2018-12-25: 10 mg via INTRAVENOUS

## 2018-12-25 MED ORDER — LIDOCAINE 2% (20 MG/ML) 5 ML SYRINGE
INTRAMUSCULAR | Status: DC | PRN
  Administered 2018-12-25: 30 mg via INTRAVENOUS

## 2018-12-25 MED ORDER — FENTANYL CITRATE (PF) 100 MCG/2ML IJ SOLN
INTRAMUSCULAR | Status: AC
  Filled 2018-12-25: qty 2

## 2018-12-25 MED ORDER — CHLORHEXIDINE GLUCONATE 4 % EX LIQD: 60.0000 mL | Freq: Once | CUTANEOUS | Status: DC

## 2018-12-25 MED ORDER — FENTANYL CITRATE (PF) 100 MCG/2ML IJ SOLN: 25.0000 ug | INTRAMUSCULAR | Status: DC | PRN

## 2018-12-25 MED ORDER — SCOPOLAMINE 1 MG/3DAYS TD PT72: 1.0000 | MEDICATED_PATCH | Freq: Once | TRANSDERMAL | Status: DC | PRN

## 2018-12-25 MED ORDER — SODIUM CHLORIDE 0.9 % IR SOLN
Status: DC | PRN
  Administered 2018-12-25: 1000 mL

## 2018-12-25 MED ORDER — OXYCODONE HCL 5 MG PO TABS: 5.0000 mg | ORAL_TABLET | Freq: Once | ORAL | Status: DC | PRN

## 2018-12-25 MED ORDER — FENTANYL CITRATE (PF) 100 MCG/2ML IJ SOLN
50.0000 ug | INTRAMUSCULAR | Status: DC | PRN
  Administered 2018-12-25: 100 ug via INTRAVENOUS
  Administered 2018-12-25: 12:00:00 50 ug via INTRAVENOUS

## 2018-12-25 MED ORDER — ONDANSETRON HCL 4 MG/2ML IJ SOLN: 4.0000 mg | Freq: Once | INTRAMUSCULAR | Status: DC | PRN

## 2018-12-25 SURGICAL SUPPLY — 71 items
BLADE SURG 15 STRL LF DISP TIS (BLADE) ×2 IMPLANT
BLADE SURG 15 STRL SS (BLADE) ×4
CHLORAPREP W/TINT 26 (MISCELLANEOUS) ×3 IMPLANT
CLOSURE WOUND 1/2 X4 (GAUZE/BANDAGES/DRESSINGS) ×1
COVER WAND RF STERILE (DRAPES) IMPLANT
DECANTER SPIKE VIAL GLASS SM (MISCELLANEOUS) IMPLANT
DRAPE C-ARM 42X72 X-RAY (DRAPES) ×3 IMPLANT
DRAPE IMP U-DRAPE 54X76 (DRAPES) ×3 IMPLANT
DRAPE INCISE IOBAN 66X45 STRL (DRAPES) ×3 IMPLANT
DRAPE U-SHAPE 47X51 STRL (DRAPES) ×3 IMPLANT
DRAPE U-SHAPE 76X120 STRL (DRAPES) ×6 IMPLANT
DRSG TEGADERM 4X4.75 (GAUZE/BANDAGES/DRESSINGS) IMPLANT
ELECT BLADE 6.5 EXT (BLADE) IMPLANT
ELECT REM PT RETURN 9FT ADLT (ELECTROSURGICAL) ×3
ELECTRODE REM PT RTRN 9FT ADLT (ELECTROSURGICAL) ×1 IMPLANT
GAUZE 4X4 16PLY RFD (DISPOSABLE) IMPLANT
GAUZE SPONGE 4X4 12PLY STRL (GAUZE/BANDAGES/DRESSINGS) ×3 IMPLANT
GLOVE BIO SURGEON STRL SZ7 (GLOVE) ×3 IMPLANT
GLOVE BIO SURGEON STRL SZ7.5 (GLOVE) ×3 IMPLANT
GLOVE BIOGEL PI IND STRL 7.0 (GLOVE) ×1 IMPLANT
GLOVE BIOGEL PI IND STRL 8 (GLOVE) ×2 IMPLANT
GLOVE BIOGEL PI INDICATOR 7.0 (GLOVE) ×2
GLOVE BIOGEL PI INDICATOR 8 (GLOVE) ×4
GOWN STRL REUS W/ TWL LRG LVL3 (GOWN DISPOSABLE) ×2 IMPLANT
GOWN STRL REUS W/ TWL XL LVL3 (GOWN DISPOSABLE) ×1 IMPLANT
GOWN STRL REUS W/TWL LRG LVL3 (GOWN DISPOSABLE) ×4
GOWN STRL REUS W/TWL XL LVL3 (GOWN DISPOSABLE) ×2
KIT AC JOINT DISP (KITS) ×3 IMPLANT
KIT BIO-TENODESIS 3X8 DISP (MISCELLANEOUS)
KIT INSRT BABSR STRL DISP BTN (MISCELLANEOUS) IMPLANT
NS IRRIG 1000ML POUR BTL (IV SOLUTION) ×3 IMPLANT
PACK ARTHROSCOPY DSU (CUSTOM PROCEDURE TRAY) ×3 IMPLANT
PACK BASIN DAY SURGERY FS (CUSTOM PROCEDURE TRAY) ×3 IMPLANT
PASSER SUT SWANSON 36MM LOOP (INSTRUMENTS) ×3 IMPLANT
PENCIL BUTTON HOLSTER BLD 10FT (ELECTRODE) ×3 IMPLANT
RETRIEVER SUT HEWSON (MISCELLANEOUS) IMPLANT
SHEET MEDIUM DRAPE 40X70 STRL (DRAPES) IMPLANT
SLEEVE SCD COMPRESS KNEE MED (MISCELLANEOUS) ×3 IMPLANT
SLING ARM FOAM STRAP LRG (SOFTGOODS) ×3 IMPLANT
SLING ARM IMMOBILIZER MED (SOFTGOODS) IMPLANT
SLING ARM MED ADULT FOAM STRAP (SOFTGOODS) IMPLANT
SLING ARM XL FOAM STRAP (SOFTGOODS) IMPLANT
SPONGE LAP 4X18 RFD (DISPOSABLE) ×3 IMPLANT
STRIP CLOSURE SKIN 1/2X4 (GAUZE/BANDAGES/DRESSINGS) ×2 IMPLANT
SUCTION FRAZIER HANDLE 10FR (MISCELLANEOUS)
SUCTION TUBE FRAZIER 10FR DISP (MISCELLANEOUS) IMPLANT
SUPPORT WRAP ARM LG (MISCELLANEOUS) IMPLANT
SUT ETHIBOND 2 OS 4 DA (SUTURE) IMPLANT
SUT ETHILON 4 0 PS 2 18 (SUTURE) IMPLANT
SUT EXPRESS BRAID BLUE WHT (MISCELLANEOUS) ×3 IMPLANT
SUT FIBERWIRE #2 38 T-5 BLUE (SUTURE)
SUT MNCRL AB 3-0 PS2 18 (SUTURE) IMPLANT
SUT MNCRL AB 4-0 PS2 18 (SUTURE) ×3 IMPLANT
SUT PDS AB 0 CT 36 (SUTURE) IMPLANT
SUT PROLENE 3 0 PS 2 (SUTURE) IMPLANT
SUT TIGER TAPE 7 IN WHITE (SUTURE) IMPLANT
SUT VIC AB 0 CT1 27 (SUTURE)
SUT VIC AB 0 CT1 27XBRD ANBCTR (SUTURE) IMPLANT
SUT VIC AB 0 SH 27 (SUTURE) ×3 IMPLANT
SUT VIC AB 2-0 SH 27 (SUTURE) ×2
SUT VIC AB 2-0 SH 27XBRD (SUTURE) ×1 IMPLANT
SUTURE FIBERWR #2 38 T-5 BLUE (SUTURE) IMPLANT
SUTURE TAPE 1.3 FIBERLOP 20 ST (SUTURE) IMPLANT
SUTURETAPE 1.3 FIBERLOOP 20 ST (SUTURE)
SYR BULB 3OZ (MISCELLANEOUS) ×3 IMPLANT
TAPE FIBER 2MM 7IN #2 BLUE (SUTURE) IMPLANT
TENDON GRACILIS FROZEN (Bone Implant) ×3 IMPLANT
TENDON GRACILIS FROZEN 230-320 (Bone Implant) ×1 IMPLANT
TOWEL GREEN STERILE FF (TOWEL DISPOSABLE) ×3 IMPLANT
YANKAUER SUCT BULB TIP NO VENT (SUCTIONS) ×3 IMPLANT
ZIPLOOP FIXATION AC JOINT DBL (Orthopedic Implant) ×3 IMPLANT

## 2018-12-25 NOTE — Anesthesia Procedure Notes (Signed)
Anesthesia Regional Block: Interscalene brachial plexus block   Pre-Anesthetic Checklist: ,, timeout performed, Correct Patient, Correct Site, Correct Laterality, Correct Procedure, Correct Position, site marked, Risks and benefits discussed,  Surgical consent,  Pre-op evaluation,  At surgeon's request and post-op pain management  Laterality: Left  Prep: chloraprep       Needles:  Injection technique: Single-shot  Needle Type: Stimulator Needle - 40      Needle Gauge: 22     Additional Needles:   Procedures:, nerve stimulator,,,,,,,  Narrative:  Start time: 12/25/2018 11:20 AM End time: 12/25/2018 11:30 AM Injection made incrementally with aspirations every 5 mL.  Performed by: Personally  Anesthesiologist: Roberts Gaudy, MD  Additional Notes: 25 cc 0.5% Bupivacaine and 10 cc Exparel injected easily

## 2018-12-25 NOTE — Transfer of Care (Signed)
Immediate Anesthesia Transfer of Care Note  Patient: Chelsea Morse  Procedure(s) Performed: LEFT ACROMIO-CLAVICULAR JOINT RECONSTRUCTION WITH ALLOGRAFT (Left Shoulder)  Patient Location: PACU  Anesthesia Type:GA combined with regional for post-op pain  Level of Consciousness: awake, alert , oriented and patient cooperative  Airway & Oxygen Therapy: Patient Spontanous Breathing and Patient connected to nasal cannula oxygen  Post-op Assessment: Report given to RN and Post -op Vital signs reviewed and stable  Post vital signs: Reviewed and stable  Last Vitals:  Vitals Value Taken Time  BP 129/81 12/25/18 1351  Temp    Pulse 59 12/25/18 1352  Resp 18 12/25/18 1352  SpO2 100 % 12/25/18 1352  Vitals shown include unvalidated device data.  Last Pain:  Vitals:   12/25/18 1022  TempSrc: Oral  PainSc: 5       Patients Stated Pain Goal: 5 (50/51/83 3582)  Complications: No apparent anesthesia complications

## 2018-12-25 NOTE — Progress Notes (Signed)
Assisted Dr. Joslin with left, ultrasound guided, interscalene  block. Side rails up, monitors on throughout procedure. See vital signs in flow sheet. Tolerated Procedure well. 

## 2018-12-25 NOTE — Anesthesia Procedure Notes (Signed)
Procedure Name: Intubation Date/Time: 12/25/2018 12:29 PM Performed by: Signe Colt, CRNA Pre-anesthesia Checklist: Patient identified, Emergency Drugs available, Suction available and Patient being monitored Patient Re-evaluated:Patient Re-evaluated prior to induction Oxygen Delivery Method: Circle system utilized Preoxygenation: Pre-oxygenation with 100% oxygen Induction Type: IV induction Ventilation: Mask ventilation without difficulty Laryngoscope Size: Mac and 3 Grade View: Grade I Tube type: Oral Tube size: 7.0 mm Number of attempts: 1 Airway Equipment and Method: Stylet and Oral airway Placement Confirmation: ETT inserted through vocal cords under direct vision,  positive ETCO2 and breath sounds checked- equal and bilateral Secured at: 21 cm Tube secured with: Tape Dental Injury: Teeth and Oropharynx as per pre-operative assessment

## 2018-12-25 NOTE — Anesthesia Preprocedure Evaluation (Signed)
Anesthesia Evaluation  Patient identified by MRN, date of birth, ID band Patient awake    Reviewed: Allergy & Precautions, NPO status , Patient's Chart, lab work & pertinent test results  Airway Mallampati: II  TM Distance: >3 FB Neck ROM: Full    Dental  (+) Teeth Intact, Dental Advisory Given   Pulmonary Current Smoker,    breath sounds clear to auscultation       Cardiovascular  Rhythm:Regular Rate:Normal     Neuro/Psych    GI/Hepatic   Endo/Other    Renal/GU      Musculoskeletal   Abdominal   Peds  Hematology   Anesthesia Other Findings   Reproductive/Obstetrics                             Anesthesia Physical Anesthesia Plan  ASA: II  Anesthesia Plan: General   Post-op Pain Management:  Regional for Post-op pain   Induction: Intravenous  PONV Risk Score and Plan: Ondansetron and Dexamethasone  Airway Management Planned: Oral ETT  Additional Equipment:   Intra-op Plan:   Post-operative Plan: Extubation in OR  Informed Consent: I have reviewed the patients History and Physical, chart, labs and discussed the procedure including the risks, benefits and alternatives for the proposed anesthesia with the patient or authorized representative who has indicated his/her understanding and acceptance.     Dental advisory given  Plan Discussed with: CRNA and Anesthesiologist  Anesthesia Plan Comments: (Smoker History of asthma, lungs clear Plan GA with ETT and interscalene block  Chelsea Morse)        Anesthesia Quick Evaluation
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
## 2018-12-25 NOTE — Discharge Instructions (Signed)
Discharge Instructions after Open Shoulder Repair  A sling has been provided for you. Remain in your sling at all times. This includes sleeping in your sling.  Use ice on the shoulder intermittently over the first 48 hours after surgery.  Pain medicine has been prescribed for you.  Use your medicine liberally over the first 48 hours, and then you can begin to taper your use. You may take Extra Strength Tylenol or Tylenol only in place of the pain pills. DO NOT take ANY nonsteroidal anti-inflammatory pain medications: Advil, Motrin, Ibuprofen, Aleve, Naproxen or Naprosyn.  You may remove your dressing after two days  You may shower 5 days after surgery. The incisions CANNOT get wet prior to 5 days. Simply allow the water to wash over the site and then pat dry. Do not rub the incisions. Make sure your axilla (armpit) is completely dry after showering.  Take one aspirin, a day for 2 weeks after surgery, unless you have an aspirin sensitivity/ allergy or asthma.   Please call [phone] during normal business hours or [phone] after hours for any problems. Including the following:  - excessive redness of the incisions - drainage for more than 4 days - fever of more than 101.5 F  *Please note that pain medications will not be refilled after hours or on weekends.     Post Anesthesia Home Care Instructions  Activity: Get plenty of rest for the remainder of the day. A responsible individual must stay with you for 24 hours following the procedure.  For the next 24 hours, DO NOT: -Drive a car -Paediatric nurse -Drink alcoholic beverages -Take any medication unless instructed by your physician -Make any legal decisions or sign important papers.  Meals: Start with liquid foods such as gelatin or soup. Progress to regular foods as tolerated. Avoid greasy, spicy, heavy foods. If nausea and/or vomiting occur, drink only clear liquids until the nausea and/or vomiting subsides. Call your  physician if vomiting continues.  Special Instructions/Symptoms: Your throat may feel dry or sore from the anesthesia or the breathing tube placed in your throat during surgery. If this causes discomfort, gargle with warm salt water. The discomfort should disappear within 24 hours.  If you had a scopolamine patch placed behind your ear for the management of post- operative nausea and/or vomiting:  1. The medication in the patch is effective for 72 hours, after which it should be removed.  Wrap patch in a tissue and discard in the trash. Wash hands thoroughly with soap and water. 2. You may remove the patch earlier than 72 hours if you experience unpleasant side effects which may include dry mouth, dizziness or visual disturbances. 3. Avoid touching the patch. Wash your hands with soap and water after contact with the patch.     Regional Anesthesia Blocks  1. Numbness or the inability to move the "blocked" extremity may last from 3-48 hours after placement. The length of time depends on the medication injected and your individual response to the medication. If the numbness is not going away after 48 hours, call your surgeon.  2. The extremity that is blocked will need to be protected until the numbness is gone and the  Strength has returned. Because you cannot feel it, you will need to take extra care to avoid injury. Because it may be weak, you may have difficulty moving it or using it. You may not know what position it is in without looking at it while the block is in effect.  3. For blocks in the legs and feet, returning to weight bearing and walking needs to be done carefully. You will need to wait until the numbness is entirely gone and the strength has returned. You should be able to move your leg and foot normally before you try and bear weight or walk. You will need someone to be with you when you first try to ensure you do not fall and possibly risk injury.  4. Bruising and tenderness at  the needle site are common side effects and will resolve in a few days.  5. Persistent numbness or new problems with movement should be communicated to the surgeon or the [place] [phone] [place] [phone]).  Information for Discharge Teaching: EXPAREL (bupivacaine liposome injectable suspension)   Your surgeon or anesthesiologist gave you EXPAREL(bupivacaine) to help control your pain after surgery.   EXPAREL is a local anesthetic that provides pain relief by numbing the tissue around the surgical site.  EXPAREL is designed to release pain medication over time and can control pain for up to 72 hours.  Depending on how you respond to EXPAREL, you may require less pain medication during your recovery.  Possible side effects:  Temporary loss of sensation or ability to move in the area where bupivacaine was injected.  Nausea, vomiting, constipation  Rarely, numbness and tingling in your mouth or lips, lightheadedness, or anxiety may occur.  Call your doctor right away if you think you may be experiencing any of these sensations, or if you have other questions regarding possible side effects.  Follow all other discharge instructions given to you by your surgeon or nurse. Eat a healthy diet and drink plenty of water or other fluids.  If you return to the hospital for any reason within 96 hours following the administration of EXPAREL, it is important for health care providers to know that you have received this anesthetic. A teal colored band has been placed on your arm with the date, time and amount of EXPAREL you have received in order to alert and inform your health care providers. Please leave this armband in place for the full 96 hours following administration, and then you may remove the band.
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
## 2018-12-26 ENCOUNTER — Encounter (HOSPITAL_BASED_OUTPATIENT_CLINIC_OR_DEPARTMENT_OTHER): Payer: Self-pay | Admitting: Orthopedic Surgery

## 2018-12-26 NOTE — Anesthesia Postprocedure Evaluation (Signed)
Anesthesia Post Note  Patient: Chelsea Morse  Procedure(s) Performed: LEFT ACROMIO-CLAVICULAR JOINT RECONSTRUCTION WITH ALLOGRAFT (Left Shoulder)     Patient location during evaluation: PACU Anesthesia Type: General and Regional Level of consciousness: awake and alert Pain management: pain level controlled Vital Signs Assessment: post-procedure vital signs reviewed and stable Respiratory status: spontaneous breathing, nonlabored ventilation, respiratory function stable and patient connected to nasal cannula oxygen Cardiovascular status: blood pressure returned to baseline and stable Postop Assessment: no apparent nausea or vomiting Anesthetic complications: no    Last Vitals:  Vitals:   12/25/18 1430 12/25/18 1525  BP: (!) 142/82 124/82  Pulse: (!) 56 (!) 52  Resp: 15 20  Temp:  37.1 C  SpO2: 98% 98%    Last Pain:  Vitals:   12/26/18 1016  TempSrc:   PainSc: 4                  Govanni Plemons COKER

## 2019-01-03 DIAGNOSIS — M25312 Other instability, left shoulder: Secondary | ICD-10-CM | POA: Diagnosis not present

## 2019-01-30 ENCOUNTER — Encounter: Payer: Self-pay | Admitting: Family Medicine

## 2019-01-30 NOTE — Progress Notes (Signed)
Subjective:   Patient ID: Chelsea Morse    DOB: [birth year], 48 y.o. female   MRN: [id number]  Chelsea Morse is a 48 y.o. female with a history of arthritis, asthma/COPD, anemia, depression w/ PTSD here for   Establish Care - recently moved from [place] last year - smokes cigars since 2017, 1 pack will last one week. MJ use for pain relief. No alcohol use. - sickle cell trait - depression w/ h/o PTSD - previously on seroquel, not currently on meds. Had a therapist in [place].  Previously saw psychiatrist, last in 2017. Associated with insomnia and eating difficulties. - asthma/COPD - takes albuterol prn, singulair, symbicort. Taking albuterol once per week. Typical triggers dust, fragrances, seasonings.  - arthritis/DDD - spine, hip. Previously has gotten injections. Follows with [facility]. Taking ?meloxicam. - has been told she has an enlarged thyroid and has previously taken medication for this but not currently.  - h/o anemia - previously with blood in urine. Denies CP, SOB. Reports persistent hematuria.  Denies dysuria.  - Surg Hx - ACL reconstruction x3. Shoulder dislocation, ganglion cyst removal, foot cyst. Cholecystectomy. - Employment: works for [employer], also receives disability due to spinal arthritis   Reproductive History - G3P3 - Menses: n/a - Contraception: s/p BTL 1994, partial hysterectomy 2015 2/2 fibroids - Cancer screening: thinks she had pap smear in 2017?  Healthcare Maintenance - Vaccines: UTD - Colonoscopy: 2013 (due to blood in stool, h/o constipation) - normal - Mammogram: 2017 - Pap Smear: ?2017  Review of Systems:  Per HPI.  [place], medications and smoking status reviewed.  Objective:   BP 122/70   Pulse 65   Ht 5\' 6"  (1.676 m)   Wt 136 lb (61.7 kg)   SpO2 99%   BMI 21.95 kg/m  Vitals and nursing note reviewed.  General: well nourished, well developed, in no acute distress with non-toxic appearance HEENT: normocephalic,  atraumatic, moist mucous membranes, fair dentition Neck: supple, non-tender without lymphadenopathy CV: regular rate and rhythm without murmurs, rubs, or gallops, no lower extremity edema Lungs: clear to auscultation bilaterally with normal work of breathing Abdomen: soft, non-tender, non-distended, normoactive bowel sounds Skin: warm, dry, no rashes or lesions Extremities: warm and well perfused, normal tone MSK: ROM grossly intact, strength intact, gait normal Neuro: Alert and oriented, speech normal Psych: appropriately dressed and well groomed. No SI/HI.  Depression screen [facility] 2/9 02/05/2019 02/05/2019  Decreased Interest 1 0  Down, Depressed, Hopeless 1 0  PHQ - 2 Score 2 0  Altered sleeping 3 -  Tired, decreased energy 1 -  Change in appetite 2 -  Feeling bad or failure about yourself  2 -  Trouble concentrating 1 -  Moving slowly or fidgety/restless 2 -  Suicidal thoughts 0 -  PHQ-9 Score 13 -  Difficult doing work/chores Somewhat difficult -   GAD 7 : Generalized Anxiety Score 02/05/2019  Nervous, Anxious, on Edge 3  Control/stop worrying 3  Worry too much - different things 2  Trouble relaxing 1  Restless 1  Easily annoyed or irritable 2  Afraid - awful might happen 1  Total GAD 7 Score 13  Anxiety Difficulty Somewhat difficult    Assessment & Plan:   Depression, recurrent (HCC) PHQ and GAD elevated today, previously on mood stabilizer and psychiatry/therapy follow up but has not reestablished since moving. Will place referral for psychiatry. List provided for therapists in our area. No SI/HI.  Osteoarthritis of spine at multiple levels Follows with Orthopedics.  Currently on meloxicam.   Anemia Prior history of anemia. Per patient was 2/2 blood in stool/urine and has persistent hematuria.  Last colonoscopy in 2013 reportedly normal and without evidence of bleeding.  Will obtain urinalysis and CBC today.  Currently asymptomatic.  Asthma Well-controlled on  Singulair, Symbicort, and prn albuterol.  Advised to avoid known triggers.  Tobacco use disorder Recommended cessation.  Patient comes to appointment with large stack of prior medical records.  Will review these and scan pertinent documents to chart.  Baseline labs obtained today.  Recommended patient follow-up in 1 month.  Orders Placed This Encounter  Procedures  . TSH  . CBC  . Basic Metabolic Panel  . POCT urinalysis dipstick  . POCT UA - Microscopic Only   No orders of the defined types were placed in this encounter.  Rory Percy, DO PGY-3, [facility] 02/06/2019 10:55 AM

## 2019-02-02 DIAGNOSIS — Z9889 Other specified postprocedural states: Secondary | ICD-10-CM | POA: Diagnosis not present

## 2019-02-02 DIAGNOSIS — M25512 Pain in left shoulder: Secondary | ICD-10-CM | POA: Diagnosis not present

## 2019-02-05 ENCOUNTER — Other Ambulatory Visit: Payer: Self-pay

## 2019-02-05 ENCOUNTER — Encounter: Payer: Self-pay | Admitting: Family Medicine

## 2019-02-05 ENCOUNTER — Ambulatory Visit (INDEPENDENT_AMBULATORY_CARE_PROVIDER_SITE_OTHER): Payer: Medicare Other | Admitting: Family Medicine

## 2019-02-05 VITALS — BP 122/70 | HR 65 | Ht 66.0 in | Wt 136.0 lb

## 2019-02-05 DIAGNOSIS — F17200 Nicotine dependence, unspecified, uncomplicated: Secondary | ICD-10-CM

## 2019-02-05 DIAGNOSIS — D649 Anemia, unspecified: Secondary | ICD-10-CM

## 2019-02-05 DIAGNOSIS — Z90710 Acquired absence of both cervix and uterus: Secondary | ICD-10-CM

## 2019-02-05 DIAGNOSIS — D573 Sickle-cell trait: Secondary | ICD-10-CM | POA: Diagnosis not present

## 2019-02-05 DIAGNOSIS — J4520 Mild intermittent asthma, uncomplicated: Secondary | ICD-10-CM | POA: Diagnosis not present

## 2019-02-05 DIAGNOSIS — E039 Hypothyroidism, unspecified: Secondary | ICD-10-CM | POA: Diagnosis not present

## 2019-02-05 DIAGNOSIS — I10 Essential (primary) hypertension: Secondary | ICD-10-CM

## 2019-02-05 DIAGNOSIS — R319 Hematuria, unspecified: Secondary | ICD-10-CM

## 2019-02-05 DIAGNOSIS — F339 Major depressive disorder, recurrent, unspecified: Secondary | ICD-10-CM

## 2019-02-05 DIAGNOSIS — Z1239 Encounter for other screening for malignant neoplasm of breast: Secondary | ICD-10-CM | POA: Diagnosis not present

## 2019-02-05 DIAGNOSIS — S43102D Unspecified dislocation of left acromioclavicular joint, subsequent encounter: Secondary | ICD-10-CM | POA: Diagnosis not present

## 2019-02-05 DIAGNOSIS — M47819 Spondylosis without myelopathy or radiculopathy, site unspecified: Secondary | ICD-10-CM

## 2019-02-05 DIAGNOSIS — M25312 Other instability, left shoulder: Secondary | ICD-10-CM | POA: Diagnosis not present

## 2019-02-05 LAB — POCT URINALYSIS DIP (MANUAL ENTRY)
Bilirubin, UA: NEGATIVE
Glucose, UA: NEGATIVE mg/dL
Ketones, POC UA: NEGATIVE mg/dL
Leukocytes, UA: NEGATIVE
Nitrite, UA: NEGATIVE
Protein Ur, POC: NEGATIVE mg/dL
Spec Grav, UA: 1.025 (ref 1.010–1.025)
Urobilinogen, UA: 0.2 E.U./dL
pH, UA: 7 (ref 5.0–8.0)

## 2019-02-05 LAB — POCT UA - MICROSCOPIC ONLY

## 2019-02-05 NOTE — Patient Instructions (Signed)
It was great to see you!  Our plans for today:  - See below for a list of therapists in this area. - We are checking some labs today, we will call you or send you a letter if they are abnormal.  - Come back in about a month to follow up.  Take care and seek immediate care sooner if you develop any concerns.   Dr. [facility]  Psychiatry and [place], [place]  [phone]  Psychiatrists  [facility]  [facility]  [address] [place] [address] [place]  [place], [place] [address] [place], [place] [address]  [phone]  Dr. Norma Fredrickson [facility]  [address] [place] [address] [place] [address]  [phone]  Sheralyn Boatman, [place]  [address] [place] [address] [place] [address]  [phone]  Therapists  [facility]  [address] [place], [place] [phone]  [facility]  [address] [place] [address] [place], [place] [phone]  [facility]  [address], [place]  [place], [place] [address] [place], [place] [address]  [phone]  [facility]  [address] [place], [place] [address] [place], [place] [address]  [phone]

## 2019-02-06 ENCOUNTER — Encounter: Payer: Self-pay | Admitting: Family Medicine

## 2019-02-06 DIAGNOSIS — F339 Major depressive disorder, recurrent, unspecified: Secondary | ICD-10-CM

## 2019-02-06 DIAGNOSIS — Z90710 Acquired absence of both cervix and uterus: Secondary | ICD-10-CM | POA: Insufficient documentation

## 2019-02-06 DIAGNOSIS — D649 Anemia, unspecified: Secondary | ICD-10-CM | POA: Insufficient documentation

## 2019-02-06 DIAGNOSIS — M47819 Spondylosis without myelopathy or radiculopathy, site unspecified: Secondary | ICD-10-CM

## 2019-02-06 DIAGNOSIS — J45909 Unspecified asthma, uncomplicated: Secondary | ICD-10-CM | POA: Insufficient documentation

## 2019-02-06 DIAGNOSIS — F17200 Nicotine dependence, unspecified, uncomplicated: Secondary | ICD-10-CM | POA: Insufficient documentation

## 2019-02-06 DIAGNOSIS — D573 Sickle-cell trait: Secondary | ICD-10-CM | POA: Insufficient documentation

## 2019-02-06 HISTORY — DX: Spondylosis without myelopathy or radiculopathy, site unspecified: M47.819

## 2019-02-06 HISTORY — DX: Acquired absence of both cervix and uterus: Z90.710

## 2019-02-06 HISTORY — DX: Major depressive disorder, recurrent, unspecified: F33.9

## 2019-02-06 LAB — BASIC METABOLIC PANEL
BUN/Creatinine Ratio: 11 (ref 9–23)
BUN: 10 mg/dL (ref 6–24)
CO2: 24 mmol/L (ref 20–29)
Calcium: 8.9 mg/dL (ref 8.7–10.2)
Chloride: 105 mmol/L (ref 96–106)
Creatinine, Ser: 0.88 mg/dL (ref 0.57–1.00)
GFR calc Af Amer: 91 mL/min/{1.73_m2} (ref >59)
GFR calc non Af Amer: 79 mL/min/{1.73_m2} (ref >59)
Glucose: 82 mg/dL (ref 65–99)
Potassium: 4 mmol/L (ref 3.5–5.2)
Sodium: 142 mmol/L (ref 134–144)

## 2019-02-06 LAB — CBC
Hematocrit: 39.7 % (ref 34.0–46.6)
Hemoglobin: 13.2 g/dL (ref 11.1–15.9)
MCH: 27.3 pg (ref 26.6–33.0)
MCHC: 33.2 g/dL (ref 31.5–35.7)
MCV: 82 fL (ref 79–97)
Platelets: 254 10*3/uL (ref 150–450)
RBC: 4.83 x10E6/uL (ref 3.77–5.28)
RDW: 13 % (ref 11.7–15.4)
WBC: 7 10*3/uL (ref 3.4–10.8)

## 2019-02-06 LAB — TSH: TSH: 0.849 u[IU]/mL (ref 0.450–4.500)

## 2019-02-06 NOTE — Assessment & Plan Note (Signed)
PHQ and GAD elevated today, previously on mood stabilizer and psychiatry/therapy follow up but has not reestablished since moving. Will place referral for psychiatry. List provided for therapists in our area. No SI/HI.

## 2019-02-06 NOTE — Assessment & Plan Note (Signed)
Recommended cessation.  

## 2019-02-06 NOTE — Assessment & Plan Note (Signed)
Well-controlled on Singulair, Symbicort, and prn albuterol.  Advised to avoid known triggers.

## 2019-02-06 NOTE — Assessment & Plan Note (Signed)
Prior history of anemia. Per patient was 2/2 blood in stool/urine and has persistent hematuria.  Last colonoscopy in 2013 reportedly normal and without evidence of bleeding.  Will obtain urinalysis and CBC today.  Currently asymptomatic.

## 2019-02-06 NOTE — Assessment & Plan Note (Signed)
Follows with Orthopedics. Currently on meloxicam.

## 2019-02-07 ENCOUNTER — Encounter: Payer: Self-pay | Admitting: Family Medicine

## 2019-02-08 DIAGNOSIS — S43102D Unspecified dislocation of left acromioclavicular joint, subsequent encounter: Secondary | ICD-10-CM | POA: Diagnosis not present

## 2019-02-08 DIAGNOSIS — M25312 Other instability, left shoulder: Secondary | ICD-10-CM | POA: Diagnosis not present

## 2019-02-10 ENCOUNTER — Other Ambulatory Visit: Payer: Self-pay | Admitting: Family Medicine

## 2019-02-10 NOTE — Addendum Note (Signed)
Addended by: Myles Gip on: 02/10/2019 09:10 AM   Modules accepted: Orders

## 2019-02-15 DIAGNOSIS — H40013 Open angle with borderline findings, low risk, bilateral: Secondary | ICD-10-CM | POA: Diagnosis not present

## 2019-02-19 ENCOUNTER — Telehealth: Payer: Medicaid Other

## 2019-02-26 DIAGNOSIS — S43102D Unspecified dislocation of left acromioclavicular joint, subsequent encounter: Secondary | ICD-10-CM | POA: Diagnosis not present

## 2019-02-26 DIAGNOSIS — M25312 Other instability, left shoulder: Secondary | ICD-10-CM | POA: Diagnosis not present

## 2019-02-28 ENCOUNTER — Other Ambulatory Visit: Payer: Self-pay

## 2019-02-28 ENCOUNTER — Encounter (HOSPITAL_COMMUNITY): Payer: Self-pay

## 2019-02-28 ENCOUNTER — Emergency Department (HOSPITAL_COMMUNITY)
Admission: EM | Admit: 2019-02-28 | Discharge: 2019-03-01 | Discharge status: Against Medical Advice | Payer: Medicare Other | Attending: Emergency Medicine | Admitting: Emergency Medicine

## 2019-02-28 DIAGNOSIS — S43102D Unspecified dislocation of left acromioclavicular joint, subsequent encounter: Secondary | ICD-10-CM | POA: Diagnosis not present

## 2019-02-28 DIAGNOSIS — Z5321 Procedure and treatment not carried out due to patient leaving prior to being seen by health care provider: Secondary | ICD-10-CM | POA: Insufficient documentation

## 2019-02-28 DIAGNOSIS — R319 Hematuria, unspecified: Secondary | ICD-10-CM | POA: Diagnosis present

## 2019-02-28 DIAGNOSIS — M25312 Other instability, left shoulder: Secondary | ICD-10-CM | POA: Diagnosis not present

## 2019-02-28 DIAGNOSIS — N3001 Acute cystitis with hematuria: Secondary | ICD-10-CM

## 2019-02-28 LAB — COMPREHENSIVE METABOLIC PANEL
ALT: 18 U/L (ref 0–44)
AST: 17 U/L (ref 15–41)
Albumin: 4.5 g/dL (ref 3.5–5.0)
Alkaline Phosphatase: 66 U/L (ref 38–126)
Anion gap: 13 (ref 5–15)
BUN: 14 mg/dL (ref 6–20)
CO2: 17 mmol/L — ABNORMAL LOW (ref 22–32)
Calcium: 9.5 mg/dL (ref 8.9–10.3)
Chloride: 108 mmol/L (ref 98–111)
Creatinine, Ser: 0.7 mg/dL (ref 0.44–1.00)
GFR calc Af Amer: >60 mL/min (ref >60)
GFR calc non Af Amer: >60 mL/min (ref >60)
Glucose, Bld: 94 mg/dL (ref 70–99)
Potassium: 3.3 mmol/L — ABNORMAL LOW (ref 3.5–5.1)
Sodium: 138 mmol/L (ref 135–145)
Total Bilirubin: 1.5 mg/dL — ABNORMAL HIGH (ref 0.3–1.2)
Total Protein: 7.9 g/dL (ref 6.5–8.1)

## 2019-02-28 LAB — CBC
HCT: 37.8 % (ref 36.0–46.0)
Hemoglobin: 13.4 g/dL (ref 12.0–15.0)
MCH: 27.7 pg (ref 26.0–34.0)
MCHC: 35.4 g/dL (ref 30.0–36.0)
MCV: 78.3 fL — ABNORMAL LOW (ref 80.0–100.0)
Platelets: 249 10*3/uL (ref 150–400)
RBC: 4.83 MIL/uL (ref 3.87–5.11)
RDW: 12.8 % (ref 11.5–15.5)
WBC: 9.3 10*3/uL (ref 4.0–10.5)
nRBC: 0 % (ref 0.0–0.2)

## 2019-02-28 LAB — URINALYSIS, ROUTINE W REFLEX MICROSCOPIC
Bilirubin Urine: NEGATIVE
Glucose, UA: NEGATIVE mg/dL
Ketones, ur: 80 mg/dL — AB
Leukocytes,Ua: NEGATIVE
Nitrite: POSITIVE — AB
Protein, ur: 30 mg/dL — AB
Specific Gravity, Urine: 1.026 (ref 1.005–1.030)
pH: 6 (ref 5.0–8.0)

## 2019-02-28 LAB — LIPASE, BLOOD: Lipase: 22 U/L (ref 11–51)

## 2019-02-28 LAB — I-STAT BETA HCG BLOOD, ED (MC, WL, AP ONLY): I-stat hCG, quantitative: <5 m[IU]/mL (ref <5)

## 2019-02-28 MED ORDER — SODIUM CHLORIDE 0.9% FLUSH: 3.0000 mL | Freq: Once | INTRAVENOUS | Status: DC

## 2019-02-28 NOTE — ED Triage Notes (Signed)
Pt states that she L sided flank pain and lower abd pain since last night and, having dysuria for months, started having hematuria today, hx of stones. Pt also adds that her L big toe hurts.

## 2019-03-01 MED ORDER — KETOROLAC TROMETHAMINE 30 MG/ML IJ SOLN
INTRAMUSCULAR | Status: AC
  Filled 2019-03-01: qty 1

## 2019-03-01 MED ORDER — SODIUM CHLORIDE 0.9 % IV SOLN
INTRAVENOUS | Status: AC
  Filled 2019-03-01: qty 10

## 2019-03-01 MED ORDER — METOCLOPRAMIDE HCL 5 MG/ML IJ SOLN
INTRAMUSCULAR | Status: AC
  Filled 2019-03-01: qty 2

## 2019-03-02 ENCOUNTER — Telehealth: Payer: Self-pay
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
## 2019-03-05 DIAGNOSIS — S43102D Unspecified dislocation of left acromioclavicular joint, subsequent encounter: Secondary | ICD-10-CM | POA: Diagnosis not present

## 2019-03-05 DIAGNOSIS — Z9889 Other specified postprocedural states: Secondary | ICD-10-CM | POA: Diagnosis not present

## 2019-03-05 DIAGNOSIS — M25512 Pain in left shoulder: Secondary | ICD-10-CM | POA: Diagnosis not present

## 2019-03-05 DIAGNOSIS — M25312 Other instability, left shoulder: Secondary | ICD-10-CM | POA: Diagnosis not present

## 2019-03-20 ENCOUNTER — Encounter: Payer: Self-pay | Admitting: Family Medicine

## 2019-04-02 DIAGNOSIS — M25512 Pain in left shoulder: Secondary | ICD-10-CM | POA: Diagnosis not present

## 2019-04-09 DIAGNOSIS — Z9889 Other specified postprocedural states: Secondary | ICD-10-CM | POA: Diagnosis not present

## 2019-04-09 DIAGNOSIS — T8484XA Pain due to internal orthopedic prosthetic devices, implants and grafts, initial encounter: Secondary | ICD-10-CM | POA: Diagnosis not present

## 2019-04-16 DIAGNOSIS — M25512 Pain in left shoulder: Secondary | ICD-10-CM | POA: Diagnosis not present

## 2019-04-16 DIAGNOSIS — Z9889 Other specified postprocedural states: Secondary | ICD-10-CM | POA: Diagnosis not present

## 2019-05-02 DIAGNOSIS — F329 Major depressive disorder, single episode, unspecified: Secondary | ICD-10-CM | POA: Diagnosis not present

## 2019-05-21 DIAGNOSIS — F329 Major depressive disorder, single episode, unspecified: Secondary | ICD-10-CM | POA: Diagnosis not present

## 2019-05-26 ENCOUNTER — Encounter (HOSPITAL_COMMUNITY): Payer: Self-pay

## 2019-05-26 ENCOUNTER — Ambulatory Visit (HOSPITAL_COMMUNITY)
Admission: EM | Admit: 2019-05-26 | Discharge: 2019-05-26 | Disposition: A | Discharge status: Home / Self Care | Payer: Medicare Other | Attending: Physician Assistant | Admitting: Physician Assistant

## 2019-05-26 ENCOUNTER — Other Ambulatory Visit: Payer: Self-pay

## 2019-05-26 DIAGNOSIS — L509 Urticaria, unspecified: Secondary | ICD-10-CM

## 2019-05-26 DIAGNOSIS — R21 Rash and other nonspecific skin eruption: Secondary | ICD-10-CM

## 2019-05-26 MED ORDER — TRIAMCINOLONE ACETONIDE 0.025 % EX OINT: 1.0000 "application " | TOPICAL_OINTMENT | Freq: Two times a day (BID) | CUTANEOUS | 0 refills | Status: DC

## 2019-05-26 MED ORDER — PREDNISONE 10 MG (21) PO TBPK: ORAL_TABLET | Freq: Every day | ORAL | 0 refills | Status: DC

## 2019-05-26 NOTE — Discharge Instructions (Addendum)
Etiology unknown. Treat with a Zyrtec daily, prednisone pack and topicals. FU as needed

## 2019-05-26 NOTE — ED Triage Notes (Signed)
Pt states she has bumps on her back, under arms and her her chest. X 4 days. Pt states she has tried some calamine lotion. Pt states she's itching.

## 2019-05-26 NOTE — ED Provider Notes (Signed)
[facility]    CSN: [id number] Arrival date & time: 05/26/19  1012      History   Chief Complaint Chief Complaint  Patient presents with  . Rash    HPI Chelsea Morse is a 48 y.o. female.    Presents with a 3 day history of pruritic rash. No known exposures. Rash is mainly on trunk and some upper legs. No change in detergents or soaps. No yard work. No fever or chills. No prior history.      Past Medical History:  Diagnosis Date  . Anemia    no meds  . Anxiety   . Arthritis   . Asthma   . Chronic fatigue   . Depression    w PTSD  . Environmental and seasonal allergies   . Hypoglycemia   . Sickle cell trait [facility])     Patient Active Problem List   Diagnosis Date Noted  . Depression, recurrent ([place]) 02/06/2019  . Osteoarthritis of spine at multiple levels 02/06/2019  . Anemia 02/06/2019  . Asthma 02/06/2019  . Tobacco use disorder 02/06/2019  . Sickle cell trait ([place]) 02/06/2019  . S/P hysterectomy 02/06/2019    Past Surgical History:  Procedure Laterality Date  . ABDOMINAL HYSTERECTOMY  2015   partial hysterectomy  . ACROMIO-CLAVICULAR JOINT REPAIR Left 12/25/2018   Procedure: LEFT ACROMIO-CLAVICULAR JOINT RECONSTRUCTION WITH ALLOGRAFT;  Surgeon: Tania Ade, MD;  Location: [facility];  Service: Orthopedics;  Laterality: Left;  . CHOLECYSTECTOMY  02/17/2000  . KNEE ARTHROSCOPY Right    x3  . ORTHOPEDIC SURGERY    . SHOULDER ARTHROSCOPY Bilateral   . TUBAL LIGATION  1994  . WRIST GANGLION EXCISION Bilateral    right x2    OB History   No obstetric history on file.      Home Medications    Prior to Admission medications   Medication Sig Start Date End Date Taking? Authorizing Provider  albuterol (PROVENTIL) (2.5 MG/3ML) 0.083% nebulizer solution Take 2.5 mg by nebulization every 6 (six) hours as needed for wheezing or shortness of breath.    [provider]  albuterol (VENTOLIN HFA) 108 (90 Base)  MCG/ACT inhaler Inhale 2 puffs into the lungs every 4 (four) hours as needed for wheezing or shortness of breath.    [provider]  budesonide-formoterol (SYMBICORT) 160-4.5 MCG/ACT inhaler Inhale 2 puffs into the lungs 2 (two) times daily.    [provider]  fluticasone (FLONASE) 50 MCG/ACT nasal spray Place 1 spray into both nostrils daily.    [provider]  Menthol-Camphor (ICY HOT ADVANCED RELIEF) 16-11 % CREA Apply 1 application topically as needed (for back pain).    [provider]  montelukast (SINGULAIR) 10 MG tablet Take 10 mg by mouth at bedtime.    [provider]    Family History Family History  Problem Relation Age of Onset  . Drug abuse Mother   . Alcohol abuse Mother   . Depression Mother   . Diabetes Mother   . Healthy Father   . Depression Sister   . Hypertension Sister   . Drug abuse Sister   . Drug abuse Brother   . Alcohol abuse Brother   . Hypertension Brother   . Depression Brother   . Sickle cell anemia Brother     Social History Social History   Tobacco Use  . Smoking status: Current Some Day Smoker    Packs/day: 0.25  . Smokeless tobacco: Never Used  Substance Use Topics  . Alcohol use: Not Currently    Frequency: Never  . Drug use: Yes    Types: Marijuana    Comment: daily at bedtime     Allergies   Asa [aspirin]   Review of Systems Review of Systems  All other systems reviewed and are negative.    Physical Exam Triage Vital Signs ED Triage Vitals  Enc Vitals Group     BP 05/26/19 1043 116/77     Pulse Rate 05/26/19 1043 69     Resp 05/26/19 1043 16     Temp 05/26/19 1043 98.4 F (36.9 C)     Temp Source 05/26/19 1043 Oral     SpO2 05/26/19 1043 99 %     Weight 05/26/19 1046 145 lb (65.8 kg)     Height --      Head Circumference --      Peak Flow --      Pain Score 05/26/19 1045 0     Pain Loc --      Pain Edu? --      Excl. in [place]? --    No data found.  Updated Vital  Signs BP 116/77 (BP Location: Right Arm)   Pulse 69   Temp 98.4 F (36.9 C) (Oral)   Resp 16   Wt 145 lb (65.8 kg)   SpO2 99%   BMI 23.40 kg/m   Visual Acuity Right Eye Distance:   Left Eye Distance:   Bilateral Distance:    Right Eye Near:   Left Eye Near:    Bilateral Near:     Physical Exam Vitals signs and nursing note reviewed.  Constitutional:      General: She is not in acute distress.    Appearance: Normal appearance.  HENT:     Head: Normocephalic and atraumatic.  Eyes:     General: No scleral icterus. Pulmonary:     Effort: Pulmonary effort is normal.  Skin:    General: Skin is warm and dry.     Findings: Rash present.     Comments: Macular papular rash to upper truck and upper thighs  Neurological:     General: No focal deficit present.     Mental Status: She is alert.  Psychiatric:        Mood and Affect: Mood normal.        Behavior: Behavior normal.      UC Treatments / Results  Labs (all labs ordered are listed, but only abnormal results are displayed) Labs Reviewed - No data to display  EKG   Radiology No results found.  Procedures Procedures (including critical care time)  Medications Ordered in UC Medications - No data to display  Initial Impression / Assessment and Plan / UC Course  I have reviewed the triage vital signs and the nursing notes.  Pertinent labs & imaging results that were available during my care of the patient were reviewed by me and considered in my medical decision making (see chart for details).     Treat symptomatically with pred, topical steroid and zyrtec. FU as needed.  Final Clinical Impressions(s) / UC Diagnoses   Final diagnoses:  None   Discharge Instructions   None    ED Prescriptions    None     PDMP not reviewed this encounter.   Bjorn Pippin, PA-C 05/26/19 1119

## 2019-06-11 ENCOUNTER — Ambulatory Visit: Payer: Medicaid Other | Admitting: Family Medicine

## 2019-06-11 NOTE — Progress Notes (Deleted)
  Subjective:   Patient ID: Chelsea Morse    DOB: [birth year], 48 y.o. female   MRN: [id number]  Chelsea Morse is a 48 y.o. female with a history of asthma, depression, anemia, sickle cell trait, tobacco use here for   *** - ***  Had urology appointment 8/31? Psychiatry referral? ***needs pap  Review of Systems:  Per HPI.  Medications and smoking status reviewed.  Objective:   There were no vitals taken for this visit. Vitals and nursing note reviewed.  General: well nourished, well developed, in no acute distress with non-toxic appearance HEENT: normocephalic, atraumatic, moist mucous membranes Neck: supple, non-tender without lymphadenopathy CV: regular rate and rhythm without murmurs, rubs, or gallops, no lower extremity edema Lungs: clear to auscultation bilaterally with normal work of breathing Abdomen: soft, non-tender, non-distended, no masses or organomegaly palpable, normoactive bowel sounds Skin: warm, dry, no rashes or lesions Extremities: warm and well perfused, normal tone MSK: ROM grossly intact, gait normal Neuro: Alert and oriented, speech normal  Assessment & Plan:   No problem-specific Assessment & Plan notes found for this encounter.  No orders of the defined types were placed in this encounter.  No orders of the defined types were placed in this encounter.   Rory Percy, DO PGY-3, [facility] 06/11/2019 8:10 AM

## 2019-08-27 ENCOUNTER — Encounter: Payer: Self-pay | Admitting: Internal Medicine

## 2019-08-29 DIAGNOSIS — M545 Low back pain: Secondary | ICD-10-CM | POA: Diagnosis not present

## 2019-09-12 ENCOUNTER — Other Ambulatory Visit: Payer: Self-pay

## 2019-09-12 ENCOUNTER — Ambulatory Visit (INDEPENDENT_AMBULATORY_CARE_PROVIDER_SITE_OTHER): Payer: Medicare Other | Admitting: Family Medicine

## 2019-09-12 ENCOUNTER — Encounter: Payer: Self-pay | Admitting: Family Medicine

## 2019-09-12 VITALS — BP 120/68 | HR 74 | Ht 66.0 in | Wt 143.0 lb

## 2019-09-12 DIAGNOSIS — M47819 Spondylosis without myelopathy or radiculopathy, site unspecified: Secondary | ICD-10-CM | POA: Diagnosis not present

## 2019-09-12 NOTE — Patient Instructions (Signed)
It was great to see you!  Our plans for today:  - Start physical therapy for your back. You can continue to take aleve as needed for pain. Only take tramadol as needed for severe pain.  - See attached for exercises to help strengthen your back to help support your spine. - If you develop loss of control of your bowels or bladder or numbness of your legs, let us know. - You are due for a pap smear, make an appointment soon to get this updated.  Take care and seek immediate care sooner if you develop any concerns.   Dr. [facility]
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
## 2019-09-13 NOTE — Assessment & Plan Note (Signed)
Chronic LBP without recent changes or worsening though frequently exacerbated by moving heavy carts at work. Vitals wnl, no red flags on history or exam to concern for infection, trauma, malignancy.  Slight disc herniation and bulging on 2017 MRI but is without sciatic symptoms so question contribution. Agree with referral to PT. Can continue to use prn NSAIDs for pain. Cautioned on prn tramadol use but likely reasonable to use to help relieve pain prior to sleep. Ortho previously provided short term light duty note for work. Provided strengthening exercises for lower back. Return to clinic if no relief after good effort with PT, could consider pain referral at that time.

## 2019-09-20 ENCOUNTER — Encounter: Payer: Self-pay | Admitting: Family Medicine

## 2019-09-20 ENCOUNTER — Ambulatory Visit: Payer: Medicaid Other | Admitting: Internal Medicine

## 2019-09-26 ENCOUNTER — Other Ambulatory Visit: Payer: Self-pay

## 2019-09-26 ENCOUNTER — Ambulatory Visit (INDEPENDENT_AMBULATORY_CARE_PROVIDER_SITE_OTHER): Payer: Medicare Other

## 2019-09-26 ENCOUNTER — Ambulatory Visit (INDEPENDENT_AMBULATORY_CARE_PROVIDER_SITE_OTHER): Payer: Medicare Other | Admitting: Gastroenterology

## 2019-09-26 ENCOUNTER — Other Ambulatory Visit (INDEPENDENT_AMBULATORY_CARE_PROVIDER_SITE_OTHER): Payer: Medicare Other

## 2019-09-26 ENCOUNTER — Encounter: Payer: Self-pay | Admitting: Gastroenterology

## 2019-09-26 VITALS — BP 112/72 | HR 81 | Temp 98.0°F | Ht 66.0 in | Wt 140.0 lb

## 2019-09-26 DIAGNOSIS — R1030 Lower abdominal pain, unspecified: Secondary | ICD-10-CM

## 2019-09-26 DIAGNOSIS — M545 Low back pain: Secondary | ICD-10-CM | POA: Diagnosis not present

## 2019-09-26 DIAGNOSIS — Z01818 Encounter for other preprocedural examination: Secondary | ICD-10-CM | POA: Diagnosis not present

## 2019-09-26 DIAGNOSIS — N83202 Unspecified ovarian cyst, left side: Secondary | ICD-10-CM | POA: Diagnosis not present

## 2019-09-26 DIAGNOSIS — R194 Change in bowel habit: Secondary | ICD-10-CM | POA: Diagnosis not present

## 2019-09-26 DIAGNOSIS — K5900 Constipation, unspecified: Secondary | ICD-10-CM

## 2019-09-26 DIAGNOSIS — Z1159 Encounter for screening for other viral diseases: Secondary | ICD-10-CM

## 2019-09-26 LAB — IBC + FERRITIN
Ferritin: 86.4 ng/mL (ref 10.0–291.0)
Iron: 76 ug/dL (ref 42–145)
Saturation Ratios: 32.7 % (ref 20.0–50.0)
Transferrin: 166 mg/dL — ABNORMAL LOW (ref 212.0–360.0)

## 2019-09-26 LAB — CBC WITH DIFFERENTIAL/PLATELET
Basophils Absolute: 0 10*3/uL (ref 0.0–0.1)
Basophils Relative: 0.5 % (ref 0.0–3.0)
Eosinophils Absolute: 0.4 10*3/uL (ref 0.0–0.7)
Eosinophils Relative: 4.7 % (ref 0.0–5.0)
HCT: 40.1 % (ref 36.0–46.0)
Hemoglobin: 13.8 g/dL (ref 12.0–15.0)
Lymphocytes Relative: 26.8 % (ref 12.0–46.0)
Lymphs Abs: 2 10*3/uL (ref 0.7–4.0)
MCHC: 34.5 g/dL (ref 30.0–36.0)
MCV: 81.4 fl (ref 78.0–100.0)
Monocytes Absolute: 0.7 10*3/uL (ref 0.1–1.0)
Monocytes Relative: 8.7 % (ref 3.0–12.0)
Neutro Abs: 4.5 10*3/uL (ref 1.4–7.7)
Neutrophils Relative %: 59.3 % (ref 43.0–77.0)
Platelets: 283 10*3/uL (ref 150.0–400.0)
RBC: 4.93 Mil/uL (ref 3.87–5.11)
RDW: 13.1 % (ref 11.5–15.5)
WBC: 7.5 10*3/uL (ref 4.0–10.5)

## 2019-09-26 LAB — TSH: TSH: 0.73 u[IU]/mL (ref 0.35–4.50)

## 2019-09-26 LAB — COMPREHENSIVE METABOLIC PANEL
ALT: 17 U/L (ref 0–35)
AST: 17 U/L (ref 0–37)
Albumin: 4.3 g/dL (ref 3.5–5.2)
Alkaline Phosphatase: 69 U/L (ref 39–117)
BUN: 14 mg/dL (ref 6–23)
CO2: 27 mEq/L (ref 19–32)
Calcium: 9.3 mg/dL (ref 8.4–10.5)
Chloride: 107 mEq/L (ref 96–112)
Creatinine, Ser: 0.78 mg/dL (ref 0.40–1.20)
GFR: 78.96 mL/min (ref >60.00)
Glucose, Bld: 60 mg/dL — ABNORMAL LOW (ref 70–99)
Potassium: 3.7 mEq/L (ref 3.5–5.1)
Sodium: 140 mEq/L (ref 135–145)
Total Bilirubin: 0.5 mg/dL (ref 0.2–1.2)
Total Protein: 7.7 g/dL (ref 6.0–8.3)

## 2019-09-26 LAB — HEMOGLOBIN A1C: Hgb A1c MFr Bld: 4.9 % (ref 4.6–6.5)

## 2019-09-26 MED ORDER — SUPREP BOWEL PREP KIT 17.5-3.13-1.6 GM/177ML PO SOLN: ORAL | 0 refills | Status: DC

## 2019-09-26 NOTE — Progress Notes (Signed)
HPI :  49 year old female with a history of reported anemia, ovarian cyst, asthma, referred by Shela Commons DO for change in bowel habits and other symptoms the patient wishes to address today.  She is originally from [place] and has been in [place] for the past year.  She states she has not seen physicians in a while and was trying to take care of multiple issues had been bothering her lately.  For the past 1 to 2 years she has had a clear change in her bowel habits.  She has a very difficult time with her bowel movements, she has a difficult time evacuating herself, sense of incomplete evacuation.  States she has a bowel movement about once per week.  Often the stools are hard, although sometimes can be loose.  She has some cramping in her lower abdomen which sometimes can be relieved with a bowel movement.  She has been noticing blood on the toilet paper lately but no blood in the stool itself.  She states she does have some discomfort in her rectal area when having a bowel movement.  She has been trying to eat much healthier lately and change her diet however this has not helped at all.    She is eating well has a good appetite, no nausea or vomiting.  She states she has been unable to gain weight and has maintained around 140 pounds over the past few years.  She did have a colonoscopy remotely, she thinks around 10 years ago, she thinks it was normal but we do not have the report to confirm that.  She denies any family history of colon cancer, she does have a strong family history of breast cancer.  She has a history of partial hysterectomy as well as cholecystectomy in 2001.  She has been seen by urology for hematuria.  She had a CT renal stone study in February of last year which showed persistent enlargement of the left ovary by cysts and a follow-up ultrasound was recommended to be done in 6 months.  She states she never was able to follow-up for that exam.  She has not seen gynecology in  a while.  She does have ongoing dyspareunia which bothers her and is asking for evaluation of that she has not had routine blood work in a while, she is requesting testing for diabetes.  She works for [employer].   CT scan renal stone study 08/12/18: IMPRESSION: 1. No acute findings within the abdomen or pelvis. No findings to account for the patient's left upper quadrant pain. No renal or ureteral stones or obstructive uropathy. 2. Persistent enlargement of the left ovary by apparent cysts, without change from prior CT. Recommend further assessment with nonemergent pelvic ultrasound as recommended on the prior CT. Status post hysterectomy. 3. Status post cholecystectomy.     Past Medical History:  Diagnosis Date  . Anemia    no meds  . Anxiety   . Arthritis   . Asthma   . Chronic fatigue   . Depression    w PTSD  . Environmental and seasonal allergies   . Hypoglycemia   . Sickle cell trait [facility])      Past Surgical History:  Procedure Laterality Date  . ABDOMINAL HYSTERECTOMY  2015   partial hysterectomy  . ACROMIO-CLAVICULAR JOINT REPAIR Left 12/25/2018   Procedure: LEFT ACROMIO-CLAVICULAR JOINT RECONSTRUCTION WITH ALLOGRAFT;  Surgeon: Tania Ade, MD;  Location: [facility];  Service: Orthopedics;  Laterality: Left;  .  CHOLECYSTECTOMY  02/17/2000  . COLONOSCOPY     age 52   . KNEE ARTHROSCOPY Right    x3  . ORTHOPEDIC SURGERY    . SHOULDER ARTHROSCOPY Bilateral   . TUBAL LIGATION  1994  . WRIST GANGLION EXCISION Bilateral    right x2   Family History  Problem Relation Age of Onset  . Drug abuse Mother   . Alcohol abuse Mother   . Depression Mother   . Diabetes Mother   . Healthy Father   . Depression Sister   . Hypertension Sister   . Drug abuse Sister   . Drug abuse Brother   . Alcohol abuse Brother   . Hypertension Brother   . Depression Brother   . Sickle cell anemia Brother   . Colon cancer Neg Hx   . Stomach cancer Neg Hx    . Pancreatic cancer Neg Hx   . Esophageal cancer Neg Hx    Social History   Tobacco Use  . Smoking status: Current Some Day Smoker    Packs/day: 0.25  . Smokeless tobacco: Never Used  Substance Use Topics  . Alcohol use: Never  . Drug use: Yes    Types: Marijuana    Comment: daily at bedtime   Current Outpatient Medications  Medication Sig Dispense Refill  . albuterol (PROVENTIL) (2.5 MG/3ML) 0.083% nebulizer solution Take 2.5 mg by nebulization every 6 (six) hours as needed for wheezing or shortness of breath.    Marland Kitchen albuterol (VENTOLIN HFA) 108 (90 Base) MCG/ACT inhaler Inhale 2 puffs into the lungs every 4 (four) hours as needed for wheezing or shortness of breath.    . budesonide-formoterol (SYMBICORT) 160-4.5 MCG/ACT inhaler Inhale 2 puffs into the lungs 2 (two) times daily.    . fluticasone (FLONASE) 50 MCG/ACT nasal spray Place 1 spray into both nostrils daily.    . Menthol-Camphor (ICY HOT ADVANCED RELIEF) 16-11 % CREA Apply 1 application topically as needed (for back pain).    . montelukast (SINGULAIR) 10 MG tablet Take 10 mg by mouth at bedtime.    . triamcinolone (KENALOG) 0.025 % ointment Apply 1 application topically 2 (two) times daily. 30 g 0  . SUPREP BOWEL PREP KIT 17.5-3.13-1.6 GM/177ML SOLN Suprep-Use as directed 354 mL 0   No current facility-administered medications for this visit.   Allergies  Allergen Reactions  . Asa [Aspirin] Other (See Comments)    "I black out"      Review of Systems: All systems reviewed and negative except where noted in HPI.    No results found.  Physical Exam: BP 112/72   Pulse 81   Temp 98 F (36.7 C)   Ht '5\' 6"'  (1.676 m)   Wt 140 lb (63.5 kg)   BMI 22.60 kg/m  Constitutional: Pleasant,well-developed, female in no acute distress. HEENT: Normocephalic and atraumatic. Conjunctivae are normal. No scleral icterus. Neck supple.  Cardiovascular: Normal rate, regular rhythm.  Pulmonary/chest: Effort normal and breath  sounds normal. No wheezing, rales or rhonchi. Abdominal: Soft, nondistended, nontender. There are no masses palpable.  Extremities: no edema Lymphadenopathy: No cervical adenopathy noted. Neurological: Alert and oriented to person place and time. Skin: Skin is warm and dry. No rashes noted. Psychiatric: Normal mood and affect. Behavior is normal.   ASSESSMENT AND PLAN: 49 year old female here for new patient assessment of the following:  Change in bowel habits / constipation / lower abdominal pain - patient had a remote colonoscopy she thinks about 10 years ago, with clear  change in her bowel habits over the past 1 to 2 years with progressive worsening, sense of incomplete evacuation despite variation in stool form.  She is due for routine colon cancer screening given her age, and in light of the symptoms colonoscopy is recommended to further evaluate.  Discussed risk and benefits of colonoscopy and anesthesia with her and she wanted to proceed.  I am recommending she use some MiraLAX at baseline to help keep stools soft and help with stool frequency, however if she is having a bowel movement once a week, recommend half MiraLAX bowel prep 2 days prior to her procedure in hopes that her bowel preparation will be more effective the day before her exam.  She was agreeable with this.  I will also send some baseline labs today to ensure stable as she is not had blood work in a while.  Of note she does have a history of microcytic anemia which may be due to sickle cell trait but will check her iron stores.  She asked me to check her A1c to screen for diabetes, we will do that for her.  We will also send TSH to ensure normal.  Further recommendations pending the results of her colonoscopy and her course.  Ovarian cyst - she has had this noted on prior imaging and follow-up ultrasound had been recommended to assess for interval changes, she never had this done.  She otherwise is complaining of dyspareunia.  I  will order her a pelvic ultrasound and refer her to gynecology for further evaluation of these issues.  She agreed  Bayville Cellar, MD [facility]  CC: Rory Percy, DO

## 2019-09-26 NOTE — Progress Notes (Unsigned)
[facility] do not accept pt's insurance. Did referral to [facility] in [place].

## 2019-09-26 NOTE — Patient Instructions (Addendum)
If you are age 48 or older, your body mass index should be between 23-30. Your Body mass index is 22.6 kg/m. If this is out of the aforementioned range listed, please consider follow up with your Primary Care Provider.  If you are age 39 or younger, your body mass index should be between 19-25. Your Body mass index is 22.6 kg/m. If this is out of the aformentioned range listed, please consider follow up with your Primary Care Provider.   You have been scheduled for a colonoscopy. Please follow written instructions given to you at your visit today.  Please pick up your prep supplies at the pharmacy within the next 1-3 days. If you use inhalers (even only as needed), please bring them with you on the day of your procedure.  Please mix 32 ounces of room temperature Gatorade with 1/2 of a 238 g bottle of Miralax.  Refrigerate.  Sip on this throughout the day today.   If you are not completely cleaned out prior to your procedure on Friday you can mix up the other half and drink that prior to your procedure.  Please go to the lab in the basement of our building to have lab work done as you leave today. Hit "B" for basement when you get on the elevator.  When the doors open the lab is on your left.  We will call you with the results. Thank you.  You have been scheduled for an pelvic ultrasound at [facility] (1st floor of hospital) on Tuesday, 10-02-19 at 11:30am. Please arrive 15 minutes prior to your appointment for registration. Make certain not to have anything to eat or drink 6 hours prior to your appointment. Should you need to reschedule your appointment, please contact radiology at [phone]. This test typically takes about 30 minutes to perform.  We will refer you to Gynecology.  They will contact you to schedule an appointment. Please let us know if you have not heard from them within a couple of weeks and we will follow up on the referral.    Thank you for entrusting me with your  care and for choosing [facility], Dr. Okmulgee Cellar

## 2019-09-27 ENCOUNTER — Encounter: Payer: Self-pay | Admitting: Obstetrics and Gynecology

## 2019-09-27 LAB — SARS CORONAVIRUS 2 (TAT 6-24 HRS): SARS Coronavirus 2: NEGATIVE

## 2019-09-28 ENCOUNTER — Encounter: Payer: Self-pay | Admitting: Gastroenterology

## 2019-09-28 ENCOUNTER — Ambulatory Visit (AMBULATORY_SURGERY_CENTER): Payer: Medicare Other | Admitting: Gastroenterology

## 2019-09-28 ENCOUNTER — Other Ambulatory Visit: Payer: Self-pay

## 2019-09-28 VITALS — BP 115/75 | HR 53 | Temp 96.2°F | Resp 21 | Ht 66.0 in | Wt 140.0 lb

## 2019-09-28 DIAGNOSIS — K635 Polyp of colon: Secondary | ICD-10-CM | POA: Diagnosis not present

## 2019-09-28 DIAGNOSIS — K648 Other hemorrhoids: Secondary | ICD-10-CM

## 2019-09-28 DIAGNOSIS — D125 Benign neoplasm of sigmoid colon: Secondary | ICD-10-CM

## 2019-09-28 DIAGNOSIS — R194 Change in bowel habit: Secondary | ICD-10-CM

## 2019-09-28 MED ORDER — SODIUM CHLORIDE 0.9 % IV SOLN: 500.0000 mL | Freq: Once | INTRAVENOUS | Status: DC

## 2019-09-28 NOTE — Op Note (Signed)
[facility] Patient Name: Chelsea Morse Procedure Date: 09/28/2019 8:00 AM MRN: [id number] Endoscopist: Remo Lipps P. Havery Moros , MD Age: 49 Referring MD:  Date of Birth: 08-Sep-1970 Gender: Female Account #: [id number] Procedure:                Colonoscopy Indications:              Change in bowel habits Medicines:                Monitored Anesthesia Care Procedure:                Pre-Anesthesia Assessment:                           - Prior to the procedure, a History and Physical                            was performed, and patient medications and                            allergies were reviewed. The patient's tolerance of                            previous anesthesia was also reviewed. The risks                            and benefits of the procedure and the sedation                            options and risks were discussed with the patient.                            All questions were answered, and informed consent                            was obtained. Prior Anticoagulants: The patient has                            taken no previous anticoagulant or antiplatelet                            agents. ASA Grade Assessment: II - A patient with                            mild systemic disease. After reviewing the risks                            and benefits, the patient was deemed in                            satisfactory condition to undergo the procedure.                           After obtaining informed consent, the colonoscope  was passed under direct vision. Throughout the                            procedure, the patient's blood pressure, pulse, and                            oxygen saturations were monitored continuously. The                            Colonoscope was introduced through the anus and                            advanced to the the terminal ileum, with                            identification of the appendiceal  orifice and IC                            valve. The colonoscopy was performed without                            difficulty. The patient tolerated the procedure                            well. The quality of the bowel preparation was                            good. The terminal ileum, ileocecal valve,                            appendiceal orifice, and rectum were photographed. Scope In: 8:03:52 AM Scope Out: 8:28:51 AM Scope Withdrawal Time: 0 hours 20 minutes 27 seconds  Total Procedure Duration: 0 hours 24 minutes 59 seconds  Findings:                 The perianal and digital rectal examinations were                            normal.                           The terminal ileum appeared normal.                           A 5 mm polyp was found in the sigmoid colon. The                            polyp was flat. The polyp was removed with a cold                            snare. Resection and retrieval were complete.                           The colon was tortuous, sigmoid colon with  angulated turns.                           Internal hemorrhoids were found during retroflexion.                           The exam was otherwise without abnormality. Complications:            No immediate complications. Estimated blood loss:                            Minimal. Estimated Blood Loss:     Estimated blood loss was minimal. Impression:               - The examined portion of the ileum was normal.                           - One 5 mm polyp in the sigmoid colon, removed with                            a cold snare. Resected and retrieved.                           - Tortuous colon.                           - Internal hemorrhoids.                           - The examination was otherwise normal.                           No significant abnormalities noted to account for                            the patient's symptoms. Recommendation:           - Patient has a  contact number available for                            emergencies. The signs and symptoms of potential                            delayed complications were discussed with the                            patient. Return to normal activities tomorrow.                            Written discharge instructions were provided to the                            patient.                           - Resume previous diet.                           -  Continue present medications.                           - Continue with Miralax daily and titrate as needed                           - Await pathology results. Remo Lipps P. Issachar Broady, MD 09/28/2019 8:34:09 AM This report has been signed electronically.

## 2019-09-28 NOTE — Progress Notes (Signed)
A and O x3. Report to RN. Tolerated MAC anesthesia well.

## 2019-09-28 NOTE — Patient Instructions (Signed)
Impression/Recommendations:  Polyp handout given to patient. Hemorrhoid handout given to patient.  Resume previous diet. Continue present medications.  Continue Miralax daily, and adjust dose as needed.  Await pathology results.  YOU HAD AN ENDOSCOPIC PROCEDURE TODAY AT [facility]:   Refer to the procedure report that was given to you for any specific questions about what was found during the examination.  If the procedure report does not answer your questions, please call your gastroenterologist to clarify.  If you requested that your care partner not be given the details of your procedure findings, then the procedure report has been included in a sealed envelope for you to review at your convenience later.  YOU SHOULD EXPECT: Some feelings of bloating in the abdomen. Passage of more gas than usual.  Walking can help get rid of the air that was put into your GI tract during the procedure and reduce the bloating. If you had a lower endoscopy (such as a colonoscopy or flexible sigmoidoscopy) you may notice spotting of blood in your stool or on the toilet paper. If you underwent a bowel prep for your procedure, you may not have a normal bowel movement for a few days.  Please Note:  You might notice some irritation and congestion in your nose or some drainage.  This is from the oxygen used during your procedure.  There is no need for concern and it should clear up in a day or so.  SYMPTOMS TO REPORT IMMEDIATELY:   Following lower endoscopy (colonoscopy or flexible sigmoidoscopy):  Excessive amounts of blood in the stool  Significant tenderness or worsening of abdominal pains  Swelling of the abdomen that is new, acute  Fever of 100F or higher For urgent or emergent issues, a gastroenterologist can be reached at any hour by calling [phone]. Do not use MyChart messaging for urgent concerns.    DIET:  We do recommend a small meal at first, but then you may proceed to  your regular diet.  Drink plenty of fluids but you should avoid alcoholic beverages for 24 hours.  ACTIVITY:  You should plan to take it easy for the rest of today and you should NOT DRIVE or use heavy machinery until tomorrow (because of the sedation medicines used during the test).    FOLLOW UP: Our staff will call the number listed on your records 48-72 hours following your procedure to check on you and address any questions or concerns that you may have regarding the information given to you following your procedure. If we do not reach you, we will leave a message.  We will attempt to reach you two times.  During this call, we will ask if you have developed any symptoms of COVID 19. If you develop any symptoms (ie: fever, flu-like symptoms, shortness of breath, cough etc.) before then, please call [phone].  If you test positive for Covid 19 in the 2 weeks post procedure, please call and report this information to [place].    If any biopsies were taken you will be contacted by phone or by letter within the next 1-3 weeks.  Please call us at [phone] if you have not heard about the biopsies in 3 weeks.    SIGNATURES/CONFIDENTIALITY: You and/or your care partner have signed paperwork which will be entered into your electronic medical record.  These signatures attest to the fact that that the information above on your After Visit Summary has been reviewed and is understood.  Full responsibility  of the confidentiality of this discharge information lies with you and/or your care-partner.

## 2019-09-28 NOTE — Progress Notes (Signed)
Called to room to assist during endoscopic procedure.  Patient ID and intended procedure confirmed with present staff. Received instructions for my participation in the procedure from the performing physician.  

## 2019-10-01 ENCOUNTER — Ambulatory Visit: Payer: Medicare Other | Attending: Orthopedic Surgery | Admitting: Physical Therapy

## 2019-10-01 ENCOUNTER — Ambulatory Visit (HOSPITAL_COMMUNITY): Admission: RE | Admit: 2019-10-01 | Payer: Medicare Other | Source: Ambulatory Visit

## 2019-10-02 ENCOUNTER — Telehealth: Payer: Self-pay | Admitting: *Deleted

## 2019-10-02 ENCOUNTER — Other Ambulatory Visit (HOSPITAL_COMMUNITY): Payer: Medicare Other

## 2019-10-02 ENCOUNTER — Telehealth: Payer: Self-pay

## 2019-10-02 NOTE — Telephone Encounter (Signed)
  Follow up Call-  Call back number 09/28/2019  Post procedure Call Back phone  # [phone]  Permission to leave phone message Yes     Patient questions:  Do you have a fever, pain , or abdominal swelling? No. Pain Score  0 *  Have you tolerated food without any problems? Yes.    Have you been able to return to your normal activities? Yes.    Do you have any questions about your discharge instructions: Diet   No. Medications  No. Follow up visit  No.  Do you have questions or concerns about your Care? No.  Actions: * If pain score is 4 or above: No action needed, pain <4.  Pt states she felt "very groggy over the weekend."  States she feels better now  1. Have you developed a fever since your procedure? no  2.   Have you had an respiratory symptoms (SOB or cough) since your procedure? no  3.   Have you tested positive for COVID 19 since your procedure no  4.   Have you had any family members/close contacts diagnosed with the COVID 19 since your procedure?  no   If yes to any of these questions please route to Joylene John, RN and Erenest Rasher, RN

## 2019-10-02 NOTE — Telephone Encounter (Signed)
1st follow up call made.  NALM 

## 2019-10-04 ENCOUNTER — Telehealth: Payer: Self-pay

## 2019-10-04 NOTE — Telephone Encounter (Signed)
FMLA forms placed in fax pile to be faxed to [employer]. Copy placed in scan pile to be scanned into chart.

## 2019-10-04 NOTE — Telephone Encounter (Signed)
Pt called to ask that paper work DR. Rumball filled out be faxed to the [employer]. Fax number is on paperwork. Dr. Ky Barban sent message to pt saying the forms were left up front for pick up. I did not see the forms up front. Ottis Stain, CMA

## 2019-10-12 DIAGNOSIS — M545 Low back pain: Secondary | ICD-10-CM | POA: Diagnosis not present

## 2019-10-16 DIAGNOSIS — M545 Low back pain: Secondary | ICD-10-CM | POA: Diagnosis not present

## 2019-10-16 DIAGNOSIS — M47816 Spondylosis without myelopathy or radiculopathy, lumbar region: Secondary | ICD-10-CM | POA: Diagnosis not present

## 2019-10-17 ENCOUNTER — Encounter: Payer: Self-pay | Admitting: Family Medicine

## 2019-10-19 ENCOUNTER — Other Ambulatory Visit: Payer: Self-pay

## 2019-10-22 ENCOUNTER — Ambulatory Visit: Payer: Medicare Other | Admitting: Obstetrics and Gynecology

## 2019-10-24 ENCOUNTER — Encounter: Payer: Self-pay | Admitting: Family Medicine

## 2019-11-12 ENCOUNTER — Encounter: Payer: Self-pay | Admitting: Family Medicine

## 2019-11-23 ENCOUNTER — Ambulatory Visit (INDEPENDENT_AMBULATORY_CARE_PROVIDER_SITE_OTHER): Payer: Medicare Other | Admitting: Family Medicine

## 2019-11-23 ENCOUNTER — Other Ambulatory Visit: Payer: Self-pay

## 2019-11-23 ENCOUNTER — Encounter: Payer: Self-pay | Admitting: Family Medicine

## 2019-11-23 VITALS — BP 114/70 | HR 70 | Ht 66.0 in | Wt 136.0 lb

## 2019-11-23 DIAGNOSIS — G4700 Insomnia, unspecified: Secondary | ICD-10-CM

## 2019-11-23 DIAGNOSIS — F339 Major depressive disorder, recurrent, unspecified: Secondary | ICD-10-CM

## 2019-11-23 NOTE — Patient Instructions (Addendum)
It was great to see you!  Our plans for today:  - We are referring you to psychiatry.  - Try the following to help you sleep better:  - limit naps during the day  - no screens (TV, phone, tablet, computer) at least 1-2 hours before bedtime.  - have a quiet and dark sleeping environment.  - no large meals or drinks about 1 hour before bed.  - Avoid taking diuretics (hydrochlorothiazide, furosemide) in the evenings.  - Avoid caffeine after 3pm.  - Exercise or move your body regularly every day.  - You can also try melatonin 10 mg over the counter. Take this 1-2 hours before bed. You can increase to 20mg  if this is not helpful. - If you are lying in bed for 30 mins-1 hour and aren't falling asleep, get out of bed and do something relaxing like reading (NO TV!) until you are tired.  Make an appointment soon for pap smear.   Take care and seek immediate care sooner if you develop any concerns.   Dr. [facility]

## 2019-11-23 NOTE — Progress Notes (Signed)
    SUBJECTIVE:   CHIEF COMPLAINT: sleep issues  HPI:   Having trouble with insomnia. Previously on seroquel for PTSD, but didn't like how it made her feel. Mainly has trouble staying asleep. Occasionally will have difficulties falling asleep. Normally sleeps for about 5 hours prior to wakening. Will have dreams that are very bothersome, a few weeks ago had dream about brother that was disturbing to her. Doesn't feel well-rested after sleep. Finding she is getting upset about small things and fighting with her fiance a lot. Tries to go to bed around 11-12am, wakes at 5am.  Doesn't have regular routine prior to bed. Often watches TV before bed. Hasn't tried any meds for sleep. Has been a while since she has seen psychiatry, wants to reestablish and see a counselor, feels she has been suppressing her feelings.  PERTINENT  PMH / PSH: Asthma, spinal OA, depression, anemia, tobacco use, sickle cell trait  OBJECTIVE:   BP 114/70   Pulse 70   Ht 5\' 6"  (1.676 m)   Wt 136 lb (61.7 kg)   BMI 21.95 kg/m   Gen: well appearing, in NAD Psych: well dressed and groomed. Speech fluent, nontangential or pressured. Mood and affect congruent.  Depression screen [facility] 2/9 11/23/2019 09/12/2019 02/05/2019 02/05/2019  Decreased Interest 2 0 1 0  Down, Depressed, Hopeless 2 0 1 0  PHQ - 2 Score 4 0 2 0  Altered sleeping 1 - 3 -  Tired, decreased energy 3 - 1 -  Change in appetite 3 - 2 -  Feeling bad or failure about yourself  3 - 2 -  Trouble concentrating 1 - 1 -  Moving slowly or fidgety/restless 2 - 2 -  Suicidal thoughts 0 - 0 -  PHQ-9 Score 17 - 13 -  Difficult doing work/chores Very difficult - Somewhat difficult -    ASSESSMENT/PLAN:   Insomnia With h/o PTSD recently worsened with difficulties sleeping and bothersome dreams. PHQ 17 today. No h/o SI. Also with poor sleep hygiene, reviewed tips today, see AVS for details. Referral placed for psychiatry for reestablishment. F/u after assessment.     Rory Percy, [place]

## 2019-11-23 NOTE — Assessment & Plan Note (Signed)
With h/o PTSD recently worsened with difficulties sleeping and bothersome dreams. PHQ 17 today. No h/o SI. Also with poor sleep hygiene, reviewed tips today, see AVS for details. Referral placed for psychiatry for reestablishment. F/u after assessment.

## 2019-12-24 ENCOUNTER — Other Ambulatory Visit: Payer: Self-pay

## 2019-12-24 ENCOUNTER — Emergency Department (HOSPITAL_COMMUNITY)
Admission: EM | Admit: 2019-12-24 | Discharge: 2019-12-24 | Disposition: A | Discharge status: Home / Self Care | Payer: Medicare (Managed Care) | Attending: Emergency Medicine | Admitting: Emergency Medicine

## 2019-12-24 ENCOUNTER — Encounter (HOSPITAL_COMMUNITY): Payer: Self-pay | Admitting: Emergency Medicine

## 2019-12-24 ENCOUNTER — Emergency Department (HOSPITAL_COMMUNITY): Payer: Medicare (Managed Care)

## 2019-12-24 DIAGNOSIS — R109 Unspecified abdominal pain: Secondary | ICD-10-CM | POA: Diagnosis present

## 2019-12-24 DIAGNOSIS — K529 Noninfective gastroenteritis and colitis, unspecified: Secondary | ICD-10-CM

## 2019-12-24 DIAGNOSIS — Z20822 Contact with and (suspected) exposure to covid-19: Secondary | ICD-10-CM | POA: Diagnosis not present

## 2019-12-24 DIAGNOSIS — R197 Diarrhea, unspecified: Secondary | ICD-10-CM | POA: Diagnosis not present

## 2019-12-24 DIAGNOSIS — R112 Nausea with vomiting, unspecified: Secondary | ICD-10-CM | POA: Diagnosis not present

## 2019-12-24 DIAGNOSIS — F17210 Nicotine dependence, cigarettes, uncomplicated: Secondary | ICD-10-CM | POA: Insufficient documentation

## 2019-12-24 DIAGNOSIS — Z9851 Tubal ligation status: Secondary | ICD-10-CM | POA: Diagnosis not present

## 2019-12-24 DIAGNOSIS — N83201 Unspecified ovarian cyst, right side: Secondary | ICD-10-CM | POA: Diagnosis not present

## 2019-12-24 LAB — CBC
HCT: 41.5 % (ref 36.0–46.0)
Hemoglobin: 14.5 g/dL (ref 12.0–15.0)
MCH: 28 pg (ref 26.0–34.0)
MCHC: 34.9 g/dL (ref 30.0–36.0)
MCV: 80.1 fL (ref 80.0–100.0)
Platelets: 231 10*3/uL (ref 150–400)
RBC: 5.18 MIL/uL — ABNORMAL HIGH (ref 3.87–5.11)
RDW: 13.3 % (ref 11.5–15.5)
WBC: 12.2 10*3/uL — ABNORMAL HIGH (ref 4.0–10.5)
nRBC: 0 % (ref 0.0–0.2)

## 2019-12-24 LAB — COMPREHENSIVE METABOLIC PANEL
ALT: 17 U/L (ref 0–44)
AST: 21 U/L (ref 15–41)
Albumin: 4.1 g/dL (ref 3.5–5.0)
Alkaline Phosphatase: 62 U/L (ref 38–126)
Anion gap: 9 (ref 5–15)
BUN: 9 mg/dL (ref 6–20)
CO2: 24 mmol/L (ref 22–32)
Calcium: 8.3 mg/dL — ABNORMAL LOW (ref 8.9–10.3)
Chloride: 104 mmol/L (ref 98–111)
Creatinine, Ser: 0.72 mg/dL (ref 0.44–1.00)
GFR calc Af Amer: >60 mL/min (ref >60)
GFR calc non Af Amer: >60 mL/min (ref >60)
Glucose, Bld: 89 mg/dL (ref 70–99)
Potassium: 3.3 mmol/L — ABNORMAL LOW (ref 3.5–5.1)
Sodium: 137 mmol/L (ref 135–145)
Total Bilirubin: 0.6 mg/dL (ref 0.3–1.2)
Total Protein: 7.8 g/dL (ref 6.5–8.1)

## 2019-12-24 LAB — I-STAT BETA HCG BLOOD, ED (MC, WL, AP ONLY): I-stat hCG, quantitative: <5 m[IU]/mL (ref <5)

## 2019-12-24 LAB — URINALYSIS, ROUTINE W REFLEX MICROSCOPIC
Bacteria, UA: NONE SEEN
Bilirubin Urine: NEGATIVE
Glucose, UA: NEGATIVE mg/dL
Ketones, ur: 80 mg/dL — AB
Nitrite: NEGATIVE
Protein, ur: NEGATIVE mg/dL
Specific Gravity, Urine: 1.013 (ref 1.005–1.030)
pH: 6 (ref 5.0–8.0)

## 2019-12-24 LAB — LIPASE, BLOOD: Lipase: 17 U/L (ref 11–51)

## 2019-12-24 LAB — SARS CORONAVIRUS 2 BY RT PCR (HOSPITAL ORDER, PERFORMED IN ~~LOC~~ HOSPITAL LAB): SARS Coronavirus 2: NEGATIVE

## 2019-12-24 MED ORDER — METOCLOPRAMIDE HCL 5 MG/ML IJ SOLN
10.0000 mg | Freq: Once | INTRAMUSCULAR | Status: AC
  Administered 2019-12-24: 10 mg via INTRAVENOUS
  Filled 2019-12-24: qty 2

## 2019-12-24 MED ORDER — SODIUM CHLORIDE (PF) 0.9 % IJ SOLN
INTRAMUSCULAR | Status: AC
  Filled 2019-12-24: qty 50

## 2019-12-24 MED ORDER — ONDANSETRON HCL 4 MG/2ML IJ SOLN
4.0000 mg | Freq: Once | INTRAMUSCULAR | Status: AC
  Administered 2019-12-24: 4 mg via INTRAVENOUS
  Filled 2019-12-24: qty 2

## 2019-12-24 MED ORDER — HYDROCODONE-ACETAMINOPHEN 5-325 MG PO TABS
1.0000 | ORAL_TABLET | Freq: Once | ORAL | Status: AC
  Administered 2019-12-24: 1 via ORAL
  Filled 2019-12-24: qty 1

## 2019-12-24 MED ORDER — METRONIDAZOLE 500 MG PO TABS
500.0000 mg | ORAL_TABLET | Freq: Two times a day (BID) | ORAL | 0 refills | Status: DC
Start: 2019-12-24 — End: 2020-03-25

## 2019-12-24 MED ORDER — ONDANSETRON 4 MG PO TBDP
4.0000 mg | ORAL_TABLET | Freq: Three times a day (TID) | ORAL | 0 refills | Status: DC | PRN
Start: 2019-12-24 — End: 2020-03-25

## 2019-12-24 MED ORDER — CIPROFLOXACIN HCL 500 MG PO TABS
500.0000 mg | ORAL_TABLET | Freq: Two times a day (BID) | ORAL | 0 refills | Status: AC
Start: 2019-12-24 — End: 2019-12-29

## 2019-12-24 MED ORDER — IOHEXOL 300 MG/ML  SOLN
100.0000 mL | Freq: Once | INTRAMUSCULAR | Status: AC | PRN
  Administered 2019-12-24: 100 mL via INTRAVENOUS

## 2019-12-24 MED ORDER — SODIUM CHLORIDE 0.9 % IV BOLUS
1000.0000 mL | Freq: Once | INTRAVENOUS | Status: AC
  Administered 2019-12-24: 1000 mL via INTRAVENOUS

## 2019-12-24 MED ORDER — DIPHENHYDRAMINE HCL 50 MG/ML IJ SOLN
25.0000 mg | Freq: Once | INTRAMUSCULAR | Status: AC
  Administered 2019-12-24: 25 mg via INTRAVENOUS
  Filled 2019-12-24: qty 1

## 2019-12-24 MED ORDER — ONDANSETRON HCL 4 MG PO TABS
4.0000 mg | ORAL_TABLET | Freq: Four times a day (QID) | ORAL | 0 refills | Status: DC
Start: 2019-12-24 — End: 2020-03-25

## 2019-12-24 MED ORDER — SODIUM CHLORIDE 0.9% FLUSH
3.0000 mL | Freq: Once | INTRAVENOUS | Status: AC
  Administered 2019-12-24: 3 mL via INTRAVENOUS

## 2019-12-24 NOTE — ED Notes (Signed)
Lab was unable to process urine therefore urine needs to be recollected .

## 2019-12-24 NOTE — ED Provider Notes (Signed)
Chelsea Morse   CSN: [id number] Arrival date & time: 12/24/19  1314     History Chief Complaint  Patient presents with  . Abdominal Pain  . Nausea  . Emesis  . Diarrhea  . Generalized Body Aches    Chelsea Morse is a 49 y.o. female with a past medical history of chronic fatigue syndrome, anxiety presenting to the ED with a chief complaint of abdominal pain, nausea, nonbloody, nonbilious emesis, diarrhea for the past 3 days.  States that symptoms began after she drank a milkshake.  Today reports change in taste and smell. Today started having a headache and states "whenever I have a headache I know there is an infection somewhere in my body."  She also reports generalized body aches and temperature of T-max 99.7.  She has been taking over-the-counter medications such as arthritis medications and Tylenol with minimal improvement in her symptoms.  She states that her husband "also has stomach problems."  She is concerned that she may have Covid and would like to be tested although denies any known Covid exposures.  She denies any urinary symptoms, pelvic complaints, chest pain, shortness of breath, cough.  Prior abdominal surgeries include cholecystectomy and partial hysterectomy.  HPI     Past Medical History:  Diagnosis Date  . Anemia    no meds  . Anxiety   . Arthritis   . Asthma   . Chronic fatigue   . Depression    w PTSD  . Environmental and seasonal allergies   . Hypoglycemia   . Sickle cell trait [facility])     Patient Active Problem List   Diagnosis Date Noted  . Insomnia 11/23/2019  . Depression, recurrent ([place]) 02/06/2019  . Osteoarthritis of spine at multiple levels 02/06/2019  . Anemia 02/06/2019  . Asthma 02/06/2019  . Tobacco use disorder 02/06/2019  . Sickle cell trait ([place]) 02/06/2019  . S/P hysterectomy 02/06/2019    Past Surgical History:  Procedure Laterality Date  . ABDOMINAL HYSTERECTOMY  2015     partial hysterectomy  . ACROMIO-CLAVICULAR JOINT REPAIR Left 12/25/2018   Procedure: LEFT ACROMIO-CLAVICULAR JOINT RECONSTRUCTION WITH ALLOGRAFT;  Surgeon: Tania Ade, MD;  Location: [place];  Service: Orthopedics;  Laterality: Left;  . CHOLECYSTECTOMY  02/17/2000  . COLONOSCOPY     age 35   . KNEE ARTHROSCOPY Right    x3  . ORTHOPEDIC SURGERY    . SHOULDER ARTHROSCOPY Bilateral   . TUBAL LIGATION  1994  . WRIST GANGLION EXCISION Bilateral    right x2     OB History   No obstetric history on file.     Family History  Problem Relation Age of Onset  . Drug abuse Mother   . Alcohol abuse Mother   . Depression Mother   . Diabetes Mother   . Healthy Father   . Depression Sister   . Hypertension Sister   . Drug abuse Sister   . Drug abuse Brother   . Alcohol abuse Brother   . Hypertension Brother   . Depression Brother   . Sickle cell anemia Brother   . Colon cancer Neg Hx   . Stomach cancer Neg Hx   . Pancreatic cancer Neg Hx   . Esophageal cancer Neg Hx     Social History   Tobacco Use  . Smoking status: Current Some Day Smoker    Packs/day: 0.25  . Smokeless tobacco: Never Used  Vaping Use  .  Vaping Use: Never used  Substance Use Topics  . Alcohol use: Never  . Drug use: Yes    Types: Marijuana    Comment: daily at bedtime    Home Medications Prior to Admission medications   Medication Sig Start Date End Date Taking? Authorizing Provider  albuterol (PROVENTIL) (2.5 MG/3ML) 0.083% nebulizer solution Take 2.5 mg by nebulization every 6 (six) hours as needed for wheezing or shortness of breath.    [provider]  albuterol (VENTOLIN HFA) 108 (90 Base) MCG/ACT inhaler Inhale 2 puffs into the lungs every 4 (four) hours as needed for wheezing or shortness of breath.    [provider]  budesonide-formoterol (SYMBICORT) 160-4.5 MCG/ACT inhaler Inhale 2 puffs into the lungs 2 (two) times daily.    [provider]   fluticasone (FLONASE) 50 MCG/ACT nasal spray Place 1 spray into both nostrils daily.    [provider]  Menthol-Camphor (ICY HOT ADVANCED RELIEF) 16-11 % CREA Apply 1 application topically as needed (for back pain).    [provider]  montelukast (SINGULAIR) 10 MG tablet Take 10 mg by mouth at bedtime.    [provider]  ondansetron (ZOFRAN ODT) 4 MG disintegrating tablet Take 1 tablet (4 mg total) by mouth every 8 (eight) hours as needed for nausea or vomiting. 12/24/19   Arham Symmonds, PA-C  triamcinolone (KENALOG) 0.025 % ointment Apply 1 application topically 2 (two) times daily. 05/26/19   Bjorn Pippin, PA-C    Allergies    Asa [aspirin]  Review of Systems   Review of Systems  Constitutional: Negative for appetite change, chills and fever.  HENT: Negative for ear pain, rhinorrhea, sneezing and sore throat.   Eyes: Negative for photophobia and visual disturbance.  Respiratory: Negative for cough, chest tightness, shortness of breath and wheezing.   Cardiovascular: Negative for chest pain and palpitations.  Gastrointestinal: Positive for abdominal pain, diarrhea, nausea and vomiting. Negative for blood in stool and constipation.  Genitourinary: Negative for dysuria, hematuria and urgency.  Musculoskeletal: Negative for myalgias.  Skin: Negative for rash.  Neurological: Positive for headaches. Negative for dizziness, weakness and light-headedness.    Physical Exam Updated Vital Signs BP 121/76 (BP Location: Right Arm)   Pulse 64   Temp 99.3 F (37.4 C) (Oral)   Resp 16   SpO2 100%   Physical Exam Vitals and nursing Morse reviewed.  Constitutional:      General: She is not in acute distress.    Appearance: She is well-developed.  HENT:     Head: Normocephalic and atraumatic.     Nose: Nose normal.  Eyes:     General: No scleral icterus.       Left eye: No discharge.     Conjunctiva/sclera: Conjunctivae normal.  Cardiovascular:     Rate  and Rhythm: Normal rate and regular rhythm.     Heart sounds: Normal heart sounds. No murmur heard.  No friction rub. No gallop.   Pulmonary:     Effort: Pulmonary effort is normal. No respiratory distress.     Breath sounds: Normal breath sounds.  Abdominal:     General: Bowel sounds are normal. There is no distension.     Palpations: Abdomen is soft.     Tenderness: There is generalized abdominal tenderness. There is no guarding.  Musculoskeletal:        General: Normal range of motion.     Cervical back: Normal range of motion and neck supple.  Skin:  General: Skin is warm and dry.     Findings: No rash.  Neurological:     Mental Status: She is alert.     Motor: No abnormal muscle tone.     Coordination: Coordination normal.     ED Results / Procedures / Treatments   Labs (all labs ordered are listed, but only abnormal results are displayed) Labs Reviewed  CBC - Abnormal; Notable for the following components:      Result Value   WBC 12.2 (*)    RBC 5.18 (*)    All other components within normal limits  SARS CORONAVIRUS 2 BY RT PCR (HOSPITAL ORDER, [place] [facility])  LIPASE, BLOOD  COMPREHENSIVE METABOLIC PANEL  URINALYSIS, ROUTINE W REFLEX MICROSCOPIC  I-STAT BETA HCG BLOOD, ED ([facility], [facility], [facility] ONLY)    EKG None  Radiology No results found.  Procedures Procedures (including critical care time)  Medications Ordered in ED Medications  sodium chloride flush (NS) 0.9 % injection 3 mL (3 mLs Intravenous Given 12/24/19 1403)  sodium chloride 0.9 % bolus 1,000 mL (1,000 mLs Intravenous New Bag/Given (Non-Interop) 12/24/19 1448)  metoCLOPramide (REGLAN) injection 10 mg (10 mg Intravenous Given 12/24/19 1449)  diphenhydrAMINE (BENADRYL) injection 25 mg (25 mg Intravenous Given 12/24/19 1451)    ED Course  I have reviewed the triage vital signs and the nursing notes.  Pertinent labs & imaging results that were available during my care of the patient  were reviewed by me and considered in my medical decision making (see chart for details).    MDM Rules/Calculators/A&P                          Jennessa Sheranda Seabrooks was evaluated in Emergency Department on 12/24/19 for the symptoms described in the history of present illness. He/she was evaluated in the context of the global COVID-19 pandemic, which necessitated consideration that the patient might be at risk for infection with the SARS-CoV-2 virus that causes COVID-19. Institutional protocols and algorithms that pertain to the evaluation of patients at risk for COVID-19 are in a state of rapid change based on information released by regulatory bodies including the CDC and federal and state organizations. These policies and algorithms were followed during the patient's care in the ED.  49 year old female presenting to the ED with a chief complaint of abdominal pain, nausea, vomiting, diarrhea, body aches and chills.  Today has a headache and change in taste and smell. Symptoms began 3 days ago after drinking a milkshake.  She is concerned that she may have Covid and like to be tested.  On exam abdomen is generally tender without rebound or guarding.  She is afebrile without recent use of antipyretics.  Will check lab work, urinalysis, give migraine cocktail and IV fluids and reassess.  Care handed off to oncoming provider pending remainder of work-up and reassessment after migraine cocktail and IV fluids.  Suspect she can be discharged home if her symptoms improve. Will give symptomatic treatment at home.   Portions of this Morse were generated with [employer]obby[employer]. Dictation errors may occur despite best attempts at proofreading.   Final Clinical Impression(s) / ED Diagnoses Final diagnoses:  Nausea vomiting and diarrhea  Suspected COVID-19 virus infection    Rx / DC Orders ED Discharge Orders         Ordered    ondansetron (ZOFRAN ODT) 4 MG disintegrating tablet  Every 8 hours  PRN  Discontinue  Reprint     12/24/19 1509           Delia Heady, PA-C 12/24/19 1510    Tegeler, Gwenyth Allegra, MD 12/24/19 [phone]

## 2019-12-24 NOTE — ED Provider Notes (Signed)
  Physical Exam  BP 121/76 (BP Location: Right Arm)   Pulse 64   Temp 99.3 F (37.4 C) (Oral)   Resp 16   SpO2 100%   Physical Exam Vitals and nursing note reviewed.  Constitutional:      General: She is not in acute distress.    Appearance: Normal appearance. She is well-developed. She is not ill-appearing, toxic-appearing or diaphoretic.  HENT:     Head: Normocephalic.  Eyes:     Conjunctiva/sclera: Conjunctivae normal.  Cardiovascular:     Rate and Rhythm: Normal rate.  Pulmonary:     Effort: Pulmonary effort is normal.  Abdominal:     Tenderness: There is abdominal tenderness in the right lower quadrant. There is guarding. Negative signs include Murphy's sign, Rovsing's sign, McBurney's sign and psoas sign.  Skin:    General: Skin is dry.  Neurological:     Mental Status: She is alert.  Psychiatric:        Mood and Affect: Mood normal.     ED Course/Procedures   Clinical Course as of Dec 24 1509  Mon Dec 24, 2019  1728 Patient continues to have nausea and abdominal pain.she does have point RLQ tenderness on exam and elevated WBC count. Will order additional medications and CT abdomen    [KM]  1850 Ct scan showing evidence of colitis. Also re-demonstration of her right ovarian cyst which is slightly larger today compared to previous Ct in 2019. Will treat for colitis w/ abx, zofran. She otherwise is stable and tolerating PO and ok for outpatient tx. She is in agreement with this plan and understands return precautions and f/u with PMD as well as obgyn for her cyst.   [KM]    Clinical Course User Index [KM] Alveria Apley, PA-C    Procedures  MDM  Care assumed from [facility] [employer] PA due to change of shift. Briefly this is a 49 y/o F coming in for generally feeling unwell. Benign physical exam and well appearing. awaiting labs, urine, covid testing.       Kristine Royal 12/25/19 1512    [name], MD 12/29/19 [phone]

## 2019-12-24 NOTE — ED Triage Notes (Signed)
The patient is from home and complains of abdominal pain, nausea, vomiting and diarrhea for 3 days. Today she reports a change in taste and smell. She also reports body aches and fever. The patient denies exposure to Covid. She has been NPO since Friday. EMS administered 400 cc of fluid and 4mg  of Zofran.      EMS vitals: 115/66 60 HR 18 Resp Rate 93 CBG 98.6 Temp 98% SPO2

## 2019-12-24 NOTE — Discharge Instructions (Addendum)
Drink plenty of fluids. Take medications as prescribed. Return to the ER if you are unable to keep anything down or your symptoms get worse. Follow up with obgyn for your ovarian cyst. Thank you for allowing me to care for you today. Please return to the emergency department if you have new or worsening symptoms. Take your medications as instructed.

## 2019-12-26 ENCOUNTER — Ambulatory Visit: Payer: Medicare Other | Admitting: Family Medicine

## 2019-12-26 NOTE — Progress Notes (Deleted)
    SUBJECTIVE:   CHIEF COMPLAINT / HPI:   ***  PERTINENT  PMH / PSH: asthma, spine OA, tobacco use, sickle cell trait  OBJECTIVE:   There were no vitals taken for this visit.  ***  ASSESSMENT/PLAN:   No problem-specific Assessment & Plan notes found for this encounter.     Rory Percy, [place]

## 2020-01-04 ENCOUNTER — Telehealth: Payer: Self-pay | Admitting: Gastroenterology

## 2020-01-04 NOTE — Telephone Encounter (Signed)
Lm on vm for patient to return call 

## 2020-01-04 NOTE — Telephone Encounter (Signed)
Thanks. If she is already scheduled to see Janett Billow on 6/29 she should keep that appointment and can discuss how to proceed at that time and if EGD is recommended based on that evaluation. Sounds like most of her symptoms are from her colon, not her stomach, but will see. She recently had a colonoscopy, not sure if she recently had infectious colitis which has caused some of this. Thanks

## 2020-01-04 NOTE — Telephone Encounter (Signed)
Pt called wanting to get scheduled for an EGD, pt reports bloating, gas, pt states that she has been having fecal incontinence. Pt states "if it's not coming from the bottom is has to be coming from the top, or the upper part". Pt denies nausea or vomiting. Pt states that she doesn't have an appetite and has to make her self eat something.  Pt states that she is only able to drink water, lemonade, kool-aid. Pt states that when she drinks soda and Gatorade she has to have a bowel movement.  Pt states that she was recently seen in the ED for food poisoning where she was diagnosed with colitis.   Pt also states that she was unable to keep her appointment for GYN evaluation of ovarian cyst, I gave patient the number to [facility] to reschedule, patient called back stating that they would need a new referral. Please advise.   Pt scheduled with Jessica Zehr-PA on 01/08/20 at 1:30 pm.

## 2020-01-04 NOTE — Telephone Encounter (Signed)
Spoke with patient regarding appt scheduled on 01/08/20 at 1:30 pm with Jessica Zehr-PA.

## 2020-01-08 ENCOUNTER — Ambulatory Visit: Payer: Medicare (Managed Care) | Admitting: Gastroenterology

## 2020-01-11 ENCOUNTER — Ambulatory Visit: Payer: Medicare (Managed Care) | Admitting: Family Medicine

## 2020-02-05 NOTE — Progress Notes (Signed)
° ° °  SUBJECTIVE:   CHIEF COMPLAINT / HPI: requesting work letter for restrictions and OBGYN referral  Patient reports difficulty lifting and pushing carts at job.  She works for [employer] and reports that she cannot complete her duties secondary to Left shoulder pain and back pain.  She reports taking Tylenol and Ibuprofen which has not helps.  Also tried PT without success. She would like time to focus on her mental health and physical body before resuming work duties in full.  She is currently trying to obtain a different work position with lighter duties at her current employer.  Patient is requesting referral to OBGYN for 7.1 cm cyst that was seen on CT abd 06/21. Denies any current abdominal pain but would like reassurance that cyst is benign.  PERTINENT  PMH / PSH:  PTSD Depression/Insomnia Chronic Fatigue Sickle Cell Trait  OBJECTIVE:   BP (!) 122/64    Pulse 50    Ht 5\' 6"  (1.676 m)    Wt 134 lb (60.8 kg)    SpO2 98%    BMI 21.63 kg/m    General: Alert and oriented, no apparent distress  Cardiovascular: RRR with no murmurs noted Respiratory: CTA bilaterally  MSK: Tenderness elicited over right shoulder, ROM wnl, Upper extremity strength 5/5 bilaterally, Lower extremity strength 5/5 bilaterally  Psych: Behavior and speech appropriate to situation  ASSESSMENT/PLAN:   Chronic pain Patient with multiple of areas of body aches, shoulders, hips, and back.  History of CFT, Depression.  Exam wnl except for mild tenderness over shoulder joint. -Advise Tylenol as needed -Low impact exercises, swimming etc -Letter to restrict lift and pushing >50lbs -CCM referral for stress management -Follow up as needed  Routine check-up Patient requesting labs and multiple referrals to keep up with health maintainence.  -Referral OBGYN for 7.1cm cyst on rt ovary -Breast cancer screening mammogram ordered -Bmet, CBC, Lipid panel, HIV and Hep C screening today -Covid vaccine today -Follow up as  needed      Carollee Leitz, MD [place]

## 2020-02-05 NOTE — Patient Instructions (Addendum)
Thank you for coming to see me today. It was a pleasure.  Referral sent to OBGYN   Please follow-up with PCP in 2-3 weeks  If you have any questions or concerns, please do not hesitate to call the office at [phone].  Best,   Carollee Leitz, [place] Providers (No Insurance required or Self Pay)  [facility]) can see uninsured folks for outpatient therapy [email or web address] [address] [place], [place] [address] [phone]  [place] Mon-Fri, 8am-3pm [email or web address], [place], [place]   [place] [phone] for psych med management, there may be a wait- if [facility] is working with clients for OPT, they will coordinate with [place] for [place]   Walk-in-Clinic: Monday- Friday 9:00 AM - 4:00 PM [place], [place] [phone]  [facility] [place] ([employer]) walk in M-F 8am-12pm and  1pm-3pm [place]- [place]  [place]  Phone: [phone]  [employer] ([place] and substance challenges) [address], [place] [phone]    [email or web address], [place]     Phone:  [phone] [place]  [place]  [place]  [phone] [email or web address].gl   [facility] ( virtual or zoom therapy) strongminds@[email or web address] .[email or web address]  [place]  [place]    [facility] [phone]  grief counseling, dementia and caregiver support    [facility] Walk-in MWF 12:30 to 3:00     [place] [address]  [phone]  www.[email or web address] call to  schedule an appointment    [place] ,Support group, Peer support services, [address] [place], [place] [address] 336- [phone]  [email or web address].[email or web address]           National Alliance on Mental Illness (NAMI) [place]- Wellness classes, Support groups        [address] [place], [place] [address] [phone]   CurrentJokes.cz   [facility]  (Psycho-social Rehabilitation clubhouse, Individual and group therapy) [address], [place] [address]   [phone]  24- Hour Availability:  *[place] or [phone] * [facility] [employer] (Domestic Violence, Rape, etc. )[phone] [place] [phone] or [phone] * [place] [phone] only) [phone] (after hours) *[facility] Mobile Crisis Unit [phone] *[place] National Suicide Hotline 336-440-5082 Diamantina Monks)

## 2020-02-06 ENCOUNTER — Encounter: Payer: Self-pay | Admitting: Family Medicine

## 2020-02-06 ENCOUNTER — Ambulatory Visit (INDEPENDENT_AMBULATORY_CARE_PROVIDER_SITE_OTHER): Payer: Medicare (Managed Care) | Admitting: Family Medicine

## 2020-02-06 ENCOUNTER — Other Ambulatory Visit: Payer: Self-pay

## 2020-02-06 VITALS — BP 122/64 | HR 50 | Ht 66.0 in | Wt 134.0 lb

## 2020-02-06 DIAGNOSIS — Z0000 Encounter for general adult medical examination without abnormal findings: Secondary | ICD-10-CM | POA: Diagnosis not present

## 2020-02-06 DIAGNOSIS — N83201 Unspecified ovarian cyst, right side: Secondary | ICD-10-CM | POA: Diagnosis not present

## 2020-02-06 DIAGNOSIS — Z23 Encounter for immunization: Secondary | ICD-10-CM | POA: Diagnosis not present

## 2020-02-06 DIAGNOSIS — F4310 Post-traumatic stress disorder, unspecified: Secondary | ICD-10-CM

## 2020-02-06 DIAGNOSIS — G894 Chronic pain syndrome: Secondary | ICD-10-CM

## 2020-02-07 ENCOUNTER — Encounter: Payer: Self-pay | Admitting: Family Medicine

## 2020-02-07 LAB — BASIC METABOLIC PANEL
BUN/Creatinine Ratio: 15 (ref 9–23)
BUN: 11 mg/dL (ref 6–24)
CO2: 21 mmol/L (ref 20–29)
Calcium: 9.6 mg/dL (ref 8.7–10.2)
Chloride: 103 mmol/L (ref 96–106)
Creatinine, Ser: 0.73 mg/dL (ref 0.57–1.00)
GFR calc Af Amer: 113 mL/min/{1.73_m2} (ref >59)
GFR calc non Af Amer: 98 mL/min/{1.73_m2} (ref >59)
Glucose: 77 mg/dL (ref 65–99)
Potassium: 3.8 mmol/L (ref 3.5–5.2)
Sodium: 140 mmol/L (ref 134–144)

## 2020-02-07 LAB — LIPID PANEL
Chol/HDL Ratio: 3.5 ratio (ref 0.0–4.4)
Cholesterol, Total: 145 mg/dL (ref 100–199)
HDL: 42 mg/dL (ref >39)
LDL Chol Calc (NIH): 85 mg/dL (ref 0–99)
Triglycerides: 95 mg/dL (ref 0–149)
VLDL Cholesterol Cal: 18 mg/dL (ref 5–40)

## 2020-02-07 LAB — CBC WITH DIFFERENTIAL/PLATELET
Basophils Absolute: 0 10*3/uL (ref 0.0–0.2)
Basos: 1 %
EOS (ABSOLUTE): 0.3 10*3/uL (ref 0.0–0.4)
Eos: 4 %
Hematocrit: 43.9 % (ref 34.0–46.6)
Hemoglobin: 14.3 g/dL (ref 11.1–15.9)
Immature Grans (Abs): 0 10*3/uL (ref 0.0–0.1)
Immature Granulocytes: 0 %
Lymphocytes Absolute: 2.6 10*3/uL (ref 0.7–3.1)
Lymphs: 35 %
MCH: 27.6 pg (ref 26.6–33.0)
MCHC: 32.6 g/dL (ref 31.5–35.7)
MCV: 85 fL (ref 79–97)
Monocytes Absolute: 0.6 10*3/uL (ref 0.1–0.9)
Monocytes: 8 %
Neutrophils Absolute: 3.9 10*3/uL (ref 1.4–7.0)
Neutrophils: 52 %
Platelets: 244 10*3/uL (ref 150–450)
RBC: 5.19 x10E6/uL (ref 3.77–5.28)
RDW: 13 % (ref 11.7–15.4)
WBC: 7.5 10*3/uL (ref 3.4–10.8)

## 2020-02-07 LAB — HCV INTERPRETATION

## 2020-02-07 LAB — HCV AB W REFLEX TO QUANT PCR: HCV Ab: <0.1 s/co ratio (ref 0.0–0.9)

## 2020-02-07 LAB — HIV ANTIBODY (ROUTINE TESTING W REFLEX): HIV Screen 4th Generation wRfx: NONREACTIVE

## 2020-02-08 ENCOUNTER — Telehealth: Payer: Self-pay | Admitting: *Deleted

## 2020-02-08 ENCOUNTER — Encounter: Payer: Self-pay | Admitting: Family Medicine

## 2020-02-08 DIAGNOSIS — Z0000 Encounter for general adult medical examination without abnormal findings: Secondary | ICD-10-CM | POA: Insufficient documentation

## 2020-02-08 DIAGNOSIS — G8929 Other chronic pain: Secondary | ICD-10-CM | POA: Insufficient documentation

## 2020-02-08 HISTORY — DX: Encounter for general adult medical examination without abnormal findings: Z00.00

## 2020-02-08 NOTE — Assessment & Plan Note (Signed)
Patient requesting labs and multiple referrals to keep up with health maintainence.  -Referral OBGYN for 7.1cm cyst on rt ovary -Breast cancer screening mammogram ordered -Bmet, CBC, Lipid panel, HIV and Hep C screening today -Covid vaccine today -Follow up as needed

## 2020-02-08 NOTE — Chronic Care Management (AMB) (Signed)
   Care Management   Outreach Note  02/08/2020 Name: Chelsea Morse MRN: [id number] DOB: [birth year]  [name] is a 49 y.o. year old female who is a primary care patient of Carollee Leitz, MD. I reached out to [name] by phone today in response to a referral sent by [name]'s PCP, Carollee Leitz, MD.     An unsuccessful telephone outreach was attempted today. The patient was referred to the case management team for assistance with care management and care coordination.   Follow Up Plan: A HIPPA compliant phone message was left for the patient providing contact information and requesting a return call. The care management team will reach out to the patient again over the next 7 days. If patient returns call to provider office, please advise to call [place] at [phone].  [name], [place] [address] Direct Dial: [phone] [email or web address] Website: [email or web address]

## 2020-02-08 NOTE — Assessment & Plan Note (Signed)
Patient with multiple of areas of body aches, shoulders, hips, and back.  History of CFT, Depression.  Exam wnl except for mild tenderness over shoulder joint. -Advise Tylenol as needed -Low impact exercises, swimming etc -Letter to restrict lift and pushing >50lbs -CCM referral for stress management -Follow up as needed

## 2020-02-12 ENCOUNTER — Telehealth: Payer: Self-pay

## 2020-02-12 NOTE — Telephone Encounter (Signed)
Patient calls nurse line requesting a work Lawyer with listed restrictions.  Patient can not lift, pull, or push anything over 0-50lbs.   Please let patient know when this is ready for pick up.

## 2020-02-13 ENCOUNTER — Encounter: Payer: Self-pay | Admitting: Family Medicine

## 2020-02-13 NOTE — Telephone Encounter (Signed)
Letter is completed.  Please let patient know.  Thank you  Carollee Leitz, MD [facility] Res[facility]

## 2020-02-14 NOTE — Telephone Encounter (Signed)
Pt was unable to see letter in [place].  Recreated to see if this will help. Christen Bame, CMA

## 2020-02-21 NOTE — Chronic Care Management (AMB) (Signed)
  Care Management   Outreach Note  02/21/2020 Name: [name] MRN: [id number] DOB: 1970/12/04  Chelsea Morse is a 49 y.o. year old female who is a primary care patient of Carollee Leitz, MD. I reached out to [name] by phone today in response to a referral sent by Ms. Dwana Virgo Stein's PCP, Carollee Leitz, MD.     A second unsuccessful telephone outreach was attempted today. The patient was referred to the case management team for assistance with care management and care coordination.   Follow Up Plan: A HIPPA compliant phone message was left for the patient providing contact information and requesting a return call. The care management team will reach out to the patient again over the next 7 days. If patient returns call to provider office, please advise to call [place] at [phone].  [place], [place] [address] Direct Dial: [phone] [email or web address] Website: [email or web address]

## 2020-02-25 ENCOUNTER — Telehealth: Payer: Self-pay | Admitting: Family Medicine

## 2020-02-25 NOTE — Telephone Encounter (Signed)
Called spoke to pt. She was very tearful. Pt states she would like Dr. Volanda Napoleon to write her out of work for 3 weeks to a month. She has called all the therapy places Dr. Volanda Napoleon has given her. Only one place in [place] takes her insurance. Work is getting more and more stressful. She is having arguments with people at work and her boyfriend. Pt states she needs a break from work to regroup. Having real bad night sweats. Pt is wondering if menopause is causing her to have such high anxiety?  Would like for Dr. Volanda Napoleon to call her [phone]. Ottis Stain, CMA

## 2020-02-25 NOTE — Telephone Encounter (Signed)
Put out of work for a couple of weeks due to anxiety. Please call patient to advise [phone] Thanks.

## 2020-02-27 ENCOUNTER — Ambulatory Visit: Payer: Medicare (Managed Care)

## 2020-02-27 NOTE — Chronic Care Management (AMB) (Signed)
  Care Management   Note  02/27/2020 Name: Chelsea Morse MRN: [id number] DOB: 1971-05-09  Phillips Odor [name] is a 49 y.o. year old female who is a primary care patient of Carollee Leitz, MD. I reached out to [name] by phone today in response to a referral sent by [name]'s health plan.    [name] was given information about care management services today including:  1. Care management services include personalized support from designated clinical staff supervised by her physician, including individualized plan of care and coordination with other care providers 2. 24/7 contact phone numbers for assistance for urgent and routine care needs. 3. The patient may stop care management services at any time by phone call to the office staff.  Patient agreed to services and verbal consent obtained.    Patient wanted to speak with Casimer Lanius LCSW today due to anxiety and stress from work. Neoma Laming was not available to speak with her at that given time, patient stated she is off tommorow and it was okay to wait informed her if an emergency arised she should call 911. Patient stated she was fine and did not want to hurt herself or anyone else.   Follow up plan: Telephone appointment with care management team member scheduled for: 02/28/2020   [facility]  [place],  [address] Direct Dial: [email or web address] Website: [email or web address]

## 2020-02-28 ENCOUNTER — Telehealth: Payer: Self-pay | Admitting: Licensed Clinical Social Worker

## 2020-02-28 ENCOUNTER — Ambulatory Visit: Payer: Medicare (Managed Care)

## 2020-02-28 ENCOUNTER — Telehealth: Payer: Medicare (Managed Care)

## 2020-02-28 NOTE — Chronic Care Management (AMB) (Signed)
    Clinical Social Work  Care Management Outreach   02/28/2020 Name: Merrie Epler MRN: [id number] DOB: 12/20/1970  Phillips Odor Gayden is a 49 y.o. year old female who is a primary care patient of Carollee Leitz, MD .  The Care Management team was consulted for assistance with Mental Health Counseling and Resources.   LCSW reached out to [name] today by phone to introduce self, assess needs and offer Care Management services and interventions.  Ms Blunck was unable to take the call.  States she will call back.    Plan: LSCW will wait for return call.  If no call is received will reach out to patient in 3 to 5 days  Review of patient status, including review of consultants reports, relevant laboratory and other test results, and collaboration with appropriate care team members and the patient's provider was performed as part of comprehensive patient evaluation and provision of care management services.    Casimer Lanius, [place] / [place]   [phone] 9:01 AM

## 2020-03-03 ENCOUNTER — Ambulatory Visit: Payer: Medicare (Managed Care)

## 2020-03-03 NOTE — Telephone Encounter (Signed)
Please call and schedule patient for office visit.  She also needs to follow up with CCM for ways to cope with anxiety.  Carollee Leitz, MD Family Medicine Residency

## 2020-03-07 ENCOUNTER — Ambulatory Visit: Payer: Medicare (Managed Care)

## 2020-03-07 ENCOUNTER — Telehealth: Payer: Self-pay | Admitting: *Deleted

## 2020-03-10 ENCOUNTER — Other Ambulatory Visit: Payer: Self-pay

## 2020-03-10 ENCOUNTER — Ambulatory Visit (INDEPENDENT_AMBULATORY_CARE_PROVIDER_SITE_OTHER): Payer: Medicare (Managed Care)

## 2020-03-10 DIAGNOSIS — Z23 Encounter for immunization: Secondary | ICD-10-CM | POA: Diagnosis not present

## 2020-03-10 NOTE — Telephone Encounter (Signed)
Pt has a 2:00 appt today (Monday)See note below, please have patient schedule an office visit with Dr. Volanda Napoleon. Ottis Stain, CMA

## 2020-03-10 NOTE — Chronic Care Management (AMB) (Signed)
  Care Management   Note  03/07/2020 Name: Kelvin Burpee MRN: [id number] DOB: Nov 19, 1970  Phillips Odor Najarian is a 49 y.o. year old female who is a primary care patient of Carollee Leitz, MD and is actively engaged with the care management team. I reached out to [name] by phone today to assist with re-scheduling an initial visit with the Licensed Clinical Social Worker  Follow up plan: Unsuccessful telephone outreach attempt made. A HIPPA compliant phone message was left for the patient providing contact information and requesting a return call.  The care management team will reach out to the patient again over the next 7 days.  If patient returns call to provider office, please advise to call [place] at [phone].  [place], [place] [address] Direct Dial: [phone] [email or web address]: [email or web address]

## 2020-03-10 NOTE — Chronic Care Management (AMB) (Signed)
  Care Management   Note  03/10/2020 Name: Chelsea Morse MRN: [id number] DOB: [birth year]  Chelsea Morse is a 49 y.o. year old female who is a primary care patient of Carollee Leitz, MD and is actively engaged with the care management team. I reached out to [name] by phone today to assist with re-scheduling an initial visit with the Licensed Clinical Education officer, museum.  Follow up plan: Telephone appointment with care management team member scheduled for: 03/19/2020  [place] [facility]  [place], [place] [address] Direct Dial: [email or web address] Website: [email or web address]

## 2020-03-11 NOTE — Progress Notes (Signed)
   Covid-19 Vaccination Clinic  Name:  Chelsea Morse    MRN: [id number] DOB: 03/15/1971  03/10/2020  Patient reports to nurse clinic for second [place] vaccination. Patient denies allergic reaction to previous vaccine and answers no to all screening questions. Administered in LD, site unremarkable, patient tolerated injection well.   [name] was observed post Covid-19 immunization for 15 minutes without incident. She was provided with Vaccine Information Sheet and instruction to access the V-Safe system.   [name] was instructed to call 911 with any severe reactions post vaccine: Marland Kitchen Difficulty breathing  . Swelling of face and throat  . A fast heartbeat  . A bad rash all over body  . Dizziness and weakness    Provided patient with updated immunization record and card.   Talbot Grumbling, RN

## 2020-03-11 NOTE — Telephone Encounter (Signed)
Scheduled

## 2020-03-19 ENCOUNTER — Ambulatory Visit: Payer: Medicare (Managed Care) | Admitting: Licensed Clinical Social Worker

## 2020-03-19 DIAGNOSIS — R4589 Other symptoms and signs involving emotional state: Secondary | ICD-10-CM

## 2020-03-19 DIAGNOSIS — F339 Major depressive disorder, recurrent, unspecified: Secondary | ICD-10-CM

## 2020-03-19 DIAGNOSIS — F439 Reaction to severe stress, unspecified: Secondary | ICD-10-CM

## 2020-03-19 DIAGNOSIS — F4310 Post-traumatic stress disorder, unspecified: Secondary | ICD-10-CM

## 2020-03-19 NOTE — Chronic Care Management (AMB) (Signed)
Social Work  Care Management  [place] Assessment  03/19/2020 Name: Chelsea Morse MRN: [id number] DOB: July 18, 1970 Chelsea Morse is a 49 y.o. year old female who sees Carollee Leitz, MD for primary care.  LCSW was consulted to assess mental health needs and assist the patient with connecting to a mental health provider. LCSW called patient to assess needs and barriers. Presenting issue / symptoms/concerns: feeling overwhelmed, difficult with sleep; irritable with friends and co-workers Duration of symptoms/ how impacting : have progressed and gotten worse over the past 1 1/2  Recent life changes: relationship difficulties; moved to [place] 2 years ago, stress from work Psychiatric History - Diagnoses: Per patient ( chronic Depression, PTSD; Anxiety) patient reports dealing with condition all her life - Hospitalizations/ prior attempts:  3 attempts; last attempt 1 1/2 years ago - Pharmacotherapy: seroquel in the past however did not work for her; Did not like the way it made her feel; as she was unable to work;  currently no medications - Outpatient therapy: 4 year therapy relation when lived in [place] has not established since relocating to [facility]  Assessment: Assessed thoughts of SI, plan and access to means. Patient denies SI however has a history of SI with 3 inpatient hospitalization treatments. She is feeling overwhelmed; difficulty with managing work, home and daily activities.  Patient See Care Plan for related entries.  Recommendation: Patient may benefit from, and is in agreement to reconnect with psychiatry for medication management. Patient would like and may also benefit from being out of work for a few weeks.  Discussed with PCP who is in agreement with the plan.    Intervention: . [place] to see if able to get patient in . Call [facility] for treatment options . [place] able to see patient first of Oct and [facility]  Urgent Care patient able to walk-in  . Collaborated with PCP and front office to get appointment scheduled for patient   Plan:  1.Patient will review treatment options disussed 2. PCP would like patient to come into the office as soon as she can; PCP is willing to complete FMLA forms 3. LCSW will F/U with patient in 24 hours   OBJECTIVE: GAD score of 13 is an indication of  moderate symptoms of anxiety.  PHQ-9 score of 12 is an indication of moderate symptoms of major depression. Depression screen [facility] 2/9 02/06/2020 11/23/2019 09/12/2019  Decreased Interest 2 2 0  Down, Depressed, Hopeless 1 2 0  PHQ - 2 Score 3 4 0  Altered sleeping 1 1 -  Tired, decreased energy 3 3 -  Change in appetite 1 3 -  Feeling bad or failure about yourself  0 3 -  Trouble concentrating 2 1 -  Moving slowly or fidgety/restless 3 2 -  Suicidal thoughts - 0 -  PHQ-9 Score 13 17 -  Difficult doing work/chores - Very difficult -     GAD 7 : Generalized Anxiety Score 02/05/2019  Nervous, Anxious, on Edge 3  Control/stop worrying 3  Worry too much - different things 2  Trouble relaxing 1  Restless 1  Easily annoyed or irritable 2  Afraid - awful might happen 1  Total GAD 7 Score 13  Anxiety Difficulty Somewhat difficult   SDOH (Social Determinants of Health) assessments performed: Yes SDOH Interventions     Most Recent Value  SDOH Interventions  SDOH Interventions for the Following Domains Depression  Depression Interventions/Treatment  Referral  to Psychiatry, Counseling      Goals Addressed            This Visit's Progress   . Connect for therapy and medication evaluation       CARE PLAN ENTRY (see longitudinal plan of care for additional care plan information)  Current Barriers:  . Patient with Anxiety and Depression acknowledges deficits with connecting to mental health provider for therapy and medication management.  . Patient is experiencing symptoms of anxiety and mood swings which seem to  be exacerbated by balancing work and relationship stressors.     . Patient needs Support, Education, and Care Coordination in order to meet unmet mental health needs  Clinical Social Work Goal(s):  Marland Kitchen Over the next 30 days, patient will reduce or manage symptoms of anxiety, depression, and mood instability until connected for ongoing counseling.  . Patient will implement clinical interventions discussed today to decreases symptoms of stress and increase knowledge and/or ability of: coping skills and self-management skills. Interventions:  . Assessed patient's  previous treatment, needs and barriers to care . Provided basic mental health support, education and interventions ( EMMI education on stress relief ) . Collaborated with PCP regarding patient needs . Discussed several options for long term counseling based on need and insurance. Assisted patient with narrowing the options down to (Mood treatment center or Behavioral Health urgent care) . Other interventions include: Motivational Interviewing Emotional/Supportive Counseling;Consulted with MD re: patient's needs) Patient Self Care Activities & Deficits:  . Patient is unable to independently navigate community resource options without care coordination support . Patient is able to implement clinical interventions discussed today  . Patient will select one of the agencies and call to schedule an appointment  . Patient is motivated for treatment Initial goal documentation      Outpatient Encounter Medications as of 03/19/2020  Medication Sig  . albuterol (PROVENTIL) (2.5 MG/3ML) 0.083% nebulizer solution Take 2.5 mg by nebulization every 6 (six) hours as needed for wheezing or shortness of breath.  Marland Kitchen albuterol (VENTOLIN HFA) 108 (90 Base) MCG/ACT inhaler Inhale 2 puffs into the lungs every 4 (four) hours as needed for wheezing or shortness of breath.  . budesonide-formoterol (SYMBICORT) 160-4.5 MCG/ACT inhaler Inhale 2 puffs into the lungs 2  (two) times daily.  . fluticasone (FLONASE) 50 MCG/ACT nasal spray Place 1 spray into both nostrils daily.  . Menthol-Camphor (ICY HOT ADVANCED RELIEF) 16-11 % CREA Apply 1 application topically as needed (for back pain).  . metroNIDAZOLE (FLAGYL) 500 MG tablet Take 1 tablet (500 mg total) by mouth 2 (two) times daily.  . montelukast (SINGULAIR) 10 MG tablet Take 10 mg by mouth at bedtime.  . ondansetron (ZOFRAN ODT) 4 MG disintegrating tablet Take 1 tablet (4 mg total) by mouth every 8 (eight) hours as needed for nausea or vomiting.  . ondansetron (ZOFRAN) 4 MG tablet Take 1 tablet (4 mg total) by mouth every 6 (six) hours.  . triamcinolone (KENALOG) 0.025 % ointment Apply 1 application topically 2 (two) times daily. (Patient taking differently: Apply 1 application topically 2 (two) times daily as needed (rash/irritation). )   No facility-administered encounter medications on file as of 03/19/2020.   Review of patient status, including review of consultants reports, relevant laboratory and other test results, and collaboration with appropriate care team members and the patient's provider was performed as part of comprehensive patient evaluation and provision of care management services.     Casimer Lanius, LCSW Care Management & Coordination  [place] / [place]   [phone] 3:11 PM

## 2020-03-20 ENCOUNTER — Ambulatory Visit: Payer: Medicare (Managed Care) | Admitting: Licensed Clinical Social Worker

## 2020-03-20 DIAGNOSIS — F439 Reaction to severe stress, unspecified: Secondary | ICD-10-CM

## 2020-03-20 DIAGNOSIS — F339 Major depressive disorder, recurrent, unspecified: Secondary | ICD-10-CM

## 2020-03-20 NOTE — Chronic Care Management (AMB) (Signed)
Care Management   Clinical Social Work Follow Up   03/20/2020 Name: Chelsea Morse MRN: [id number] DOB: June 08, 1971 Referred by: Carollee Leitz, MD  Reason for referral : Care Coordination (F/U)  Chelsea Morse is a 49 y.o. year old female who is a primary care patient of Carollee Leitz, MD.  Reason for follow-up: assess for barriers and progress with care plan .   Assessment: Patient continues to experience symptoms of depression and anxiety which seems to be impacting work and home.     Plan:  1. Patient has appointment with PCP 9/14 to discuss FMLA paper work 2.  Patient will also [facility] at [facility] Advance Directive Status:  not addressed during this encounter.  SDOH (Social Determinants of Health) assessments performed: ; No new needs identified   Goals Addressed            This Visit's Progress    Connect for therapy and medication evaluation       CARE PLAN ENTRY (see longitudinal plan of care for additional care plan information)  Current Barriers:   Patient with Anxiety and Depression acknowledges deficits with connecting to mental health provider for therapy and medication management.   Patient is experiencing symptoms of anxiety and mood swings which seem to be exacerbated by balancing work and relationship stressors.      Patient needs Support, Education, and Care Coordination in order to meet unmet mental health needs   Patient reviewed information provided by LCSW and called both places; she agreed to appointment with PCP 9/14 Clinical Social Work Goal(s):   Over the next 30 days, patient will reduce or manage symptoms of anxiety, depression, and mood instability until connected for ongoing counseling.   Patient will implement clinical interventions discussed today to decreases symptoms of stress and increase knowledge and/or ability of: coping skills and self-management skills. Interventions:   Assessed patient's   previous treatment, needs and barriers to care  Collaborated with PCP regarding patient needs  Discussed several options for long term counseling based on need and insurance. Assisted patient with narrowing the options down to (Mood treatment center or [place] urgent care)  Patient will have employer fax FMLA paper work to provider 03/21/20  Patient will do walk-in at [place]  Patient Self Care Activities & Deficits:   Patient is unable to independently navigate community resource options without care coordination support  Patient is able to implement clinical interventions discussed today   Patient will select one of the agencies and call to schedule an appointment   Patient is motivated for treatment Please see past updates related to this goal by clicking on the "Past Updates" button in the selected goal        Outpatient Encounter Medications as of 03/20/2020  Medication Sig   albuterol (PROVENTIL) (2.5 MG/3ML) 0.083% nebulizer solution Take 2.5 mg by nebulization every 6 (six) hours as needed for wheezing or shortness of breath.   albuterol (VENTOLIN HFA) 108 (90 Base) MCG/ACT inhaler Inhale 2 puffs into the lungs every 4 (four) hours as needed for wheezing or shortness of breath.   budesonide-formoterol (SYMBICORT) 160-4.5 MCG/ACT inhaler Inhale 2 puffs into the lungs 2 (two) times daily.   fluticasone (FLONASE) 50 MCG/ACT nasal spray Place 1 spray into both nostrils daily.   Menthol-Camphor (ICY HOT ADVANCED RELIEF) 16-11 % CREA Apply 1 application topically as needed (for back pain).   metroNIDAZOLE (FLAGYL) 500 MG tablet Take 1 tablet (500  mg total) by mouth 2 (two) times daily.   montelukast (SINGULAIR) 10 MG tablet Take 10 mg by mouth at bedtime.   ondansetron (ZOFRAN ODT) 4 MG disintegrating tablet Take 1 tablet (4 mg total) by mouth every 8 (eight) hours as needed for nausea or vomiting.   ondansetron (ZOFRAN) 4 MG tablet Take 1 tablet (4  mg total) by mouth every 6 (six) hours.   triamcinolone (KENALOG) 0.025 % ointment Apply 1 application topically 2 (two) times daily. (Patient taking differently: Apply 1 application topically 2 (two) times daily as needed (rash/irritation). )   No facility-administered encounter medications on file as of 03/20/2020.   Review of patient status, including review of consultants reports, relevant laboratory and other test results, and collaboration with appropriate care team members and the patient's provider was performed as part of comprehensive patient evaluation and provision of care management services.    Casimer Lanius, [place] / [place]   [phone] 4:27 PM

## 2020-03-23 NOTE — Progress Notes (Signed)
    SUBJECTIVE:   CHIEF COMPLAINT / HPI: Anxiety and Depression  Was recently assessed by Chelsea Morse and reports that she is having issues at home with significant other as well as at work.  She feels like she has no support and is frequently in arguments with boyfriend and work.  Repots that sometime she feels like she would be "better off not here"  She denies any SI/HI today but is extremely upset.  Was seen at [facility] earlier today and was discharged with recommendation for outpatient medication assessment and therapy.  Patient had previously taken Seroquel but reports she didn't like how it made her feel.  PERTINENT  PMH / PSH:  H/O Depression/Anxiety/PTSD   OBJECTIVE:   BP 92/60   Pulse (!) 51   Ht 5\' 6"  (1.676 m)   Wt 132 lb 3.2 oz (60 kg)   SpO2 98%   BMI 21.34 kg/m    General: Alert and oriented, no apparent distress  Cardiovascular: RRR with no murmurs noted Respiratory: CTA bilaterally  Psych: Tearful and angry. No SI/HI  ASSESSMENT/PLAN:   Depression, recurrent (HCC) Evaluated PHQ/GAD scores 20/21.  MDQ positive screen. No SI/HI -Start Lexapro 5 mg daily -Continue therapy with CCM -Follow up with Psychiatry -Letter provided to be off work for until Oct 1/21 -Patient will provide FMLA forms for completion -Mental health resources provided -24hr crisis line provided -Follow up in 1 week     Chelsea Leitz, MD [place]

## 2020-03-23 NOTE — Patient Instructions (Addendum)
It was nice seeing you  today!  Start Lexapro 5 mg daily.  Follow up with me next week Sept 20 at 130 pm  If you have any depressed mood or thoughts of self harm call the 24 hr crisis line or go to the ED   If you have any questions or concerns, please feel free to call the clinic at [phone].   Be well,  Carollee Leitz, MD Family Medicine Residency   Therapy and Counseling Resources Most providers on this list will take Medicaid. Patients with commercial insurance or Medicare should contact their insurance company to get a list of in network providers.  [place] [address], [place], [place] [address]       [phone]     [facility] [address] [place], [place]    Jinny Blossom [facility] [address] [place], [place]  [facility]:  [place]. [place]  [facility]:  [place] [address]oni [place], [place]  [facility] (under & uninsured) [address] [place] [address] [place] [phone]    [email or web address]    [facility] [place]     Phone:  [phone]     [facility]  [place] [address]      [place], [place] ext Pax: [place], [place], [place]   [place] ([place] therapist) [address] [place] [address]   [place] [address]    [phone]    [facility]   [place],  [place], [place]   [place]  [place]  [facility]  [address] [place], [place]        [phone]  Open Access/Walk In Clinic under & uninsured [place],  [address] [place] [phone]):  Mon - Fri  from 8 AM - 3 PM  [facility] [place],  ([place])   [place], [place]: [phone]) 8:30 - 12; 1 - 2:30  [facility] the [place],  [place], [place]    [phone]):8:30 - 12; 2 - 3PM  [facility] [employer],  [address],  [place]; [phone]):   Mon - Fri 8 AM - 5 PM  [facility] [place]  MWF 12:30 to 3:00 or call to schedule an appointment  [phone]  Specific Provider options Psychology Today  [email or web address]/us 1. click on find a therapist  2. enter your zip code 3. left side and select or tailor a therapist for your specific need.   [facility] Provider Directory [email or web address]  (Medicaid)   Follow all drop down to find a provider  [place] or [email or web address] [address], [place], [place] Recovery support and educational   In home counseling [place] Telephone: [phone]  office in [place] [email or web address]   Does not take reg. Medicaid or Medicare private insurance [facility], [place] health Choice, [facility], [place], [place], [place], [place] Health Choice  24- Hour Availability:  . [place] or [phone]  . Family Service of the [place]  Crisis Line [phone]  [place] Service  [phone]   . [place]  [phone] (after hours)  . Therapeutic Alternative/Mobile Crisis   [phone]  . [place] National Suicide Hotline  7403967557 ([place])  . Call 911 or go to emergency room  . [employer]  [phone]);  [place] and [employer]   . [facility] ACCESS  [phone]); [place], [place], [place], [place], [place], [place], [place]

## 2020-03-25 ENCOUNTER — Ambulatory Visit: Payer: Medicare (Managed Care) | Admitting: Licensed Clinical Social Worker

## 2020-03-25 ENCOUNTER — Ambulatory Visit (HOSPITAL_COMMUNITY)
Admission: EM | Admit: 2020-03-25 | Discharge: 2020-03-25 | Disposition: A | Discharge status: Home / Self Care | Payer: Medicare (Managed Care) | Attending: Psychiatry | Admitting: Psychiatry

## 2020-03-25 ENCOUNTER — Encounter: Payer: Self-pay | Admitting: Family Medicine

## 2020-03-25 ENCOUNTER — Other Ambulatory Visit: Payer: Self-pay

## 2020-03-25 ENCOUNTER — Ambulatory Visit (INDEPENDENT_AMBULATORY_CARE_PROVIDER_SITE_OTHER): Payer: Medicare (Managed Care) | Admitting: Family Medicine

## 2020-03-25 DIAGNOSIS — F419 Anxiety disorder, unspecified: Secondary | ICD-10-CM | POA: Diagnosis not present

## 2020-03-25 DIAGNOSIS — F339 Major depressive disorder, recurrent, unspecified: Secondary | ICD-10-CM

## 2020-03-25 DIAGNOSIS — F332 Major depressive disorder, recurrent severe without psychotic features: Secondary | ICD-10-CM | POA: Insufficient documentation

## 2020-03-25 DIAGNOSIS — F329 Major depressive disorder, single episode, unspecified: Secondary | ICD-10-CM | POA: Diagnosis present

## 2020-03-25 DIAGNOSIS — Z7189 Other specified counseling: Secondary | ICD-10-CM

## 2020-03-25 MED ORDER — ESCITALOPRAM OXALATE 5 MG PO TABS
5.0000 mg | ORAL_TABLET | Freq: Every day | ORAL | 0 refills | Status: DC
Start: 2020-03-25 — End: 2020-04-18

## 2020-03-25 NOTE — ED Provider Notes (Signed)
Behavioral Health Urgent Care Medical Screening Exam  Patient Name: Chelsea Morse MRN: [id number] Date of Evaluation: 03/25/20 Chief Complaint:   Diagnosis:  Final diagnoses:  Severe episode of recurrent major depressive disorder, without psychotic features ([place])    History of Present illness: Chelsea Morse is a 49 y.o. female. She is presenting for increased anxiety and depression related to stress at work and conflict with her fiance. She denies any SI/HI/AVH.  Psychiatric Specialty Exam  Presentation  General Appearance:Casual  Eye Contact:Good  Speech:Clear and Coherent;Normal Rate  Speech Volume:Normal  Handedness:No data recorded  Mood and Affect  Mood:Anxious  Affect:Congruent   Thought Process  Thought Processes:Coherent;Goal Directed  Descriptions of Associations:Tangential  Orientation:Full (Time, Place and Person)  Thought Content:Logical  Hallucinations:None  Ideas of Reference:None  Suicidal Thoughts:No  Homicidal Thoughts:No   Sensorium  Memory:Immediate Good;Recent Good;Remote Good  Judgment:Intact  Insight:Fair   Executive Functions  Concentration:Fair  Attention Span:Fair  [place]  Language:Good   Psychomotor Activity  Psychomotor Activity:Normal   Assets  Assets:Communication Skills;Desire for Improvement;Housing;Resilience   Sleep  Sleep:Fair  Number of hours: No data recorded  Physical Exam: Physical Exam Vitals reviewed.  Constitutional:      Appearance: She is well-developed.  Cardiovascular:     Rate and Rhythm: Normal rate.  Pulmonary:     Effort: Pulmonary effort is normal.  Neurological:     Mental Status: She is alert and oriented to person, place, and time.    Review of Systems  Constitutional: Negative.   Respiratory: Negative for cough and shortness of breath.   Psychiatric/Behavioral: Positive for depression. Negative for hallucinations, memory  loss, substance abuse and suicidal ideas. The patient is nervous/anxious. The patient does not have insomnia.    Blood pressure 121/79, pulse (!) 57, temperature 98.5 F (36.9 C), temperature source Oral, resp. rate 18, height 5\' 6"  (1.676 m), weight 139 lb (63 kg), SpO2 100 %. Body mass index is 22.44 kg/m.  Musculoskeletal: Strength & Muscle Tone: within normal limits Gait & Station: normal Patient leans: N/A   [place] MSE Discharge Disposition for Follow up and Recommendations: Based on my evaluation the patient does not appear to have an emergency medical condition and can be discharged with resources and follow up care in outpatient services for Medication Management and Individual Therapy   Connye Burkitt, NP 03/25/2020, 8:38 AM

## 2020-03-25 NOTE — Discharge Instructions (Signed)
You are encouraged to follow up with [facility] outpatient clinic to pursue outpatient therapy.  They are scheduling into November, however you may come for walk in times noted below.  [address] [place], [place]  Walk in therapy hours: Monday-Friday - 8AM  Monday-Friday - 4PM Fridays 1PM-4PM    [facility]: [place], [place], [place] [phone]

## 2020-03-25 NOTE — Chronic Care Management (AMB) (Signed)
Care Management   Clinical Social Work Follow Up   03/25/2020 Name: Chelsea Morse MRN: [id number] DOB: 1970/11/23 Referred by: Carollee Leitz, MD  Reason for referral : Care Coordination (mental health support)  Chelsea Morse is a 49 y.o. year old female who is a primary care patient of Carollee Leitz, MD.  Reason for follow-up: assess for barriers and progress with connecting with mental health provider .  PCP started patient on new medication.  Patient needs medication evacuation by psych and ongoing counseling.  Assessment: Patient continues to experience difficulty with navigating and meeting mental health needs. Patient would like continued follow-up from CCM LCSW. Plan: F/U scheduled tomorrow   Advance Directive Status: ; not addressed during this encounter.    Goals Addressed            This Visit's Progress   . Connect for therapy and medication evaluation   On track    [place] (see longitudinal plan of care for additional care plan information)  Current Barriers:  . Patient with Anxiety and Depression acknowledges deficits with connecting to mental health provider for therapy and medication management.  . Patient is experiencing symptoms of anxiety and mood swings which seem to be exacerbated by balancing work and relationship stressors.     . Patient needs Support, Education, and Care Coordination in order to meet unmet mental health needs  . Patient reviewed information provided by LCSW and called both places; she agreed to appointment with PCP 9/14 Clinical Social Work Goal(s):  Marland Kitchen Over the next 30 days, patient will reduce or manage symptoms of anxiety, depression, and mood instability until connected for ongoing counseling.  . Patient will implement clinical interventions discussed today to decreases symptoms of stress and increase knowledge and/or ability of: coping skills and self-management skills. Interventions:  . Assessed patient's needs and  barriers to care . Collaborated with PCP regarding patient needs . Discussed several options for long term counseling based on need and insurance. Assisted patient with narrowing the options down to ([facility] center or Behavioral Health urgent care) . Patient will have employer fax FMLA paper work to provider 03/21/20 . LCSW assisting with locating a provider to meet patient's needs that takes her insurance. Patient Self Care Activities & Deficits:  . Patient is unable to independently navigate community resource options without care coordination support . Patient is able to implement clinical interventions discussed today  . Patient will select one of the agencies and call to schedule an appointment  . Patient is motivated for treatment Please see past updates related to this goal by clicking on the "Past Updates" button in the selected goal        Outpatient Encounter Medications as of 03/25/2020  Medication Sig  . albuterol (PROVENTIL) (2.5 MG/3ML) 0.083% nebulizer solution Take 2.5 mg by nebulization every 6 (six) hours as needed for wheezing or shortness of breath.  Marland Kitchen albuterol (VENTOLIN HFA) 108 (90 Base) MCG/ACT inhaler Inhale 2 puffs into the lungs every 4 (four) hours as needed for wheezing or shortness of breath.  . budesonide-formoterol (SYMBICORT) 160-4.5 MCG/ACT inhaler Inhale 2 puffs into the lungs 2 (two) times daily.  Marland Kitchen escitalopram (LEXAPRO) 5 MG tablet Take 1 tablet (5 mg total) by mouth daily.  . fluticasone (FLONASE) 50 MCG/ACT nasal spray Place 1 spray into both nostrils daily.  . Menthol-Camphor (ICY HOT ADVANCED RELIEF) 16-11 % CREA Apply 1 application topically as needed (for back pain).  . montelukast (SINGULAIR) 10 MG  tablet Take 10 mg by mouth at bedtime.  . triamcinolone (KENALOG) 0.025 % ointment Apply 1 application topically 2 (two) times daily. (Patient taking differently: Apply 1 application topically 2 (two) times daily as needed (rash/irritation). )   No  facility-administered encounter medications on file as of 03/25/2020.   Review of patient status, including review of consultants reports, relevant laboratory and other test results, and collaboration with appropriate care team members and the patient's provider was performed as part of comprehensive patient evaluation and provision of care management services.    Casimer Lanius, [place] / [place]   [phone] 3:46 PM

## 2020-03-25 NOTE — ED Notes (Signed)
Patient belongings in locker 81

## 2020-03-25 NOTE — ED Notes (Signed)
Patient discharged home, AVS reviewed and given to patient. All belongings returned to patient. Patient escorted to lobby.
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
## 2020-03-26 ENCOUNTER — Ambulatory Visit: Payer: Medicare (Managed Care) | Admitting: Licensed Clinical Social Worker

## 2020-03-26 ENCOUNTER — Telehealth: Payer: Self-pay | Admitting: Licensed Clinical Social Worker

## 2020-03-26 DIAGNOSIS — Z7189 Other specified counseling: Secondary | ICD-10-CM

## 2020-03-26 DIAGNOSIS — F339 Major depressive disorder, recurrent, unspecified: Secondary | ICD-10-CM

## 2020-03-26 NOTE — Chronic Care Management (AMB) (Signed)
    Clinical Social Work  Care Management Outreach   03/26/2020 Name: Chelsea Morse MRN: [id number] DOB: 1971/03/02  Phillips Odor Benda is a 49 y.o. year old female who is a primary care patient of Carollee Leitz, MD .  The Care Management team was consulted for assistance with Mental Health Counseling and Resources.   LCSW reached out to [name] twice today by phone per request from patient for ongoing assessment of needs and Care Management services.  The outreach was unsuccessful.  Unable to leave a HIPPA compliant phone message due to voice message not being set up.   Plan: LCSW will outreach again with in 24 to 48 hours  Review of patient status, including review of consultants reports, relevant laboratory and other test results, and collaboration with appropriate care team members and the patient's provider was performed as part of comprehensive patient evaluation and provision of care management services.    Casimer Lanius, [place] / [place]   [phone] 10:23 AM

## 2020-03-26 NOTE — Chronic Care Management (AMB) (Signed)
Care Management   Clinical Social Work Follow Up   03/26/2020 Name: Chelsea Morse MRN: [id number] DOB: [birth year] Referred by: Carollee Leitz, MD  Reason for referral : Care Coordination  Chelsea Morse is a 49 y.o. year old female who is a primary care patient of Carollee Leitz, MD.  Reason for follow-up: assess for barriers and progress with care plan .    Assessment: Patient continues to experience difficulty with coping and managing symptoms of depression and anxiety .    Patient is making progress towards goal see care plan below. Patient would like continued follow-up from CCM LCSW. Plan: Phone F/U scheduled in 1 week  Advance Directive Status:  not addressed during this encounter.  SDOH (Social Determinants of Health) assessments performed: No new needs identified   Goals Addressed            This Visit's Progress   . Connect for therapy and medication evaluation   On track    [place] (see longitudinal plan of care for additional care plan information)  Current Barriers:  . Patient with Anxiety and Depression acknowledges deficits with connecting to mental health provider for therapy and medication management.  . Patient is experiencing symptoms of anxiety and mood swings which seem to be exacerbated by balancing work and relationship stressors.     . Patient needs Support, Education, and Care Coordination in order to meet unmet mental health needs  Clinical Social Work Goal(s):  Marland Kitchen Over the next 30 days, patient will reduce or manage symptoms of anxiety, depression, and mood instability until connected for ongoing counseling.  . Patient will implement clinical interventions discussed today to decreases symptoms of stress and increase knowledge and/or ability of: coping skills and self-management skills. Interventions:  . Assessed patient's, needs coping skills and barriers to care . Provided basic mental health support, education and interventions   . Collaborated with PCP regarding patient needs . Discussed several options for long term counseling based on need and insurance. Assisted patient with narrowing the options down to [facility]  ) . Reviewed mental health medications with patient prescribed by PCP and discussed compliance( patient has not picked up medication but states will get it today . Arlee with patient on phone counseling appointment scheduled Sept 28th at 12:00 and medication management is Nov. 19th at 1:00 . Other interventions include: Motivational Interviewing ;Psychoeducation and/or Health Education Patient Self Care Activities & Deficits:  . Patient is unable to independently navigate community resource options without care coordination support . Patient will keep schedule an appointments  . Patient is motivated for treatment Please see past updates related to this goal by clicking on the "Past Updates" button in the selected goal        Outpatient Encounter Medications as of 03/26/2020  Medication Sig  . albuterol (PROVENTIL) (2.5 MG/3ML) 0.083% nebulizer solution Take 2.5 mg by nebulization every 6 (six) hours as needed for wheezing or shortness of breath.  Marland Kitchen albuterol (VENTOLIN HFA) 108 (90 Base) MCG/ACT inhaler Inhale 2 puffs into the lungs every 4 (four) hours as needed for wheezing or shortness of breath.  . budesonide-formoterol (SYMBICORT) 160-4.5 MCG/ACT inhaler Inhale 2 puffs into the lungs 2 (two) times daily.  Marland Kitchen escitalopram (LEXAPRO) 5 MG tablet Take 1 tablet (5 mg total) by mouth daily.  . fluticasone (FLONASE) 50 MCG/ACT nasal spray Place 1 spray into both nostrils daily.  . Menthol-Camphor (ICY HOT ADVANCED RELIEF) 16-11 % CREA Apply 1 application  topically as needed (for back pain).  . montelukast (SINGULAIR) 10 MG tablet Take 10 mg by mouth at bedtime.  . triamcinolone (KENALOG) 0.025 % ointment Apply 1 application topically 2 (two) times daily. (Patient taking differently:  Apply 1 application topically 2 (two) times daily as needed (rash/irritation). )   No facility-administered encounter medications on file as of 03/26/2020.   Review of patient status, including review of consultants reports, relevant laboratory and other test results, and collaboration with appropriate care team members and the patient's provider was performed as part of comprehensive patient evaluation and provision of care management services.    Casimer Lanius, [place] / [place]   [phone] 12:23 PM

## 2020-03-30 ENCOUNTER — Encounter: Payer: Self-pay | Admitting: Family Medicine

## 2020-03-30 NOTE — Assessment & Plan Note (Addendum)
Evaluated PHQ/GAD scores 20/21.  MDQ positive screen. No SI/HI -Start Lexapro 5 mg daily -Continue therapy with CCM -Follow up with Psychiatry -Letter provided to be off work for until Oct 1/21 -Patient will provide FMLA forms for completion -Mental health resources provided -24hr crisis line provided -Follow up in 1 week

## 2020-03-31 ENCOUNTER — Ambulatory Visit: Payer: Medicare (Managed Care) | Admitting: Family Medicine

## 2020-03-31 NOTE — Progress Notes (Deleted)
    SUBJECTIVE:   CHIEF COMPLAINT / HPI: Follow up depression and anxiety  Started Lexapro 5 mg daily last week.  Tolerating medication.  Mood *** Reports decrease** in depressive mood and anxiety***  Continues to see Casimer Lanius for therapy.Denies any SI/HI.  PERTINENT  PMH / PSH: ***  OBJECTIVE:   There were no vitals taken for this visit.   General: Alert and oriented, no apparent distress  Psych: Behavior and speech appropriate to situation  ASSESSMENT/PLAN:   No problem-specific Assessment & Plan notes found for this encounter.     Carollee Leitz, MD [place]

## 2020-04-02 ENCOUNTER — Ambulatory Visit: Payer: Medicare (Managed Care) | Admitting: Licensed Clinical Social Worker

## 2020-04-02 DIAGNOSIS — Z7189 Other specified counseling: Secondary | ICD-10-CM

## 2020-04-02 DIAGNOSIS — F339 Major depressive disorder, recurrent, unspecified: Secondary | ICD-10-CM

## 2020-04-02 NOTE — Chronic Care Management (AMB) (Signed)
Care Management   Clinical Social Work Follow Up   04/02/2020 Name: Chelsea Morse MRN: [id number] DOB: [birth year] Referred by: Chelsea Leitz, MD  Reason for referral : Care Coordination (F/U call)  Chelsea Morse is a 49 y.o. year old female who is a primary care patient of Chelsea Leitz, MD.  Reason for follow-up: assess for barriers and progress with care plan .    Assessment: Patient continues to experience symptoms of depression and anxiety.Patient is making progress towards goal. States starting to see notice a slight change in behavior.  See care plan below.  Patient would like continued follow-up from CCM LCSW. Plan:  1. Patient will keep 1st therapy appointment Sep. 28th 2.  Will Bring FMLA papers to office today for PCP to complete 3. LCSW will F/U with patient in 1 week  Advance Directive Status: not addressed during this encounter.  SDOH (Social Determinants of Health) assessments performed: No needs identified   Goals Addressed            This Visit's Progress   . Connect for therapy and medication evaluation   On track    [place] (see longitudinal plan of care for additional care plan information)  Current Barriers:  . Patient with Anxiety and Depression acknowledges deficits with connecting to mental health provider for therapy and medication management.  . Patient is experiencing symptoms of anxiety and mood swings which seem to be exacerbated by balancing work and relationship stressors.     . Patient needs Support, Education, and Care Coordination in order to meet unmet mental health needs  . Parent reports she is starting to feel a little better than before, not feeling overwhelmed as she was, no new or emotional outburst over the past weeks, states significant other has noticed a change. Clinical Social Work [employer]):  Chelsea Morse Over the next 30 days, patient will reduce or manage symptoms of anxiety, depression, and mood instability until connected  for ongoing counseling.  . Patient will implement clinical interventions discussed today to decreases symptoms of stress and increase knowledge and/or ability of: coping skills and self-management skills. Interventions:  . Assessed patient's, needs coping skills and barriers to care . Provided basic mental health support, education and interventions ( behavioral activation, setting goals) . Collaborated with PCP regarding patient needs,medication and missed appointment . Reviewed mental health medications with patient prescribed by PCP and discussed compliance( patient picked up and started taking medication Sept. 15th reports so concerns. . Reminded patient of counseling appointment scheduled Sept 28th with [facility] at 12:00 and medication management is Nov. 19th at 1:00 . Other interventions include: Motivational Interviewing ;Psychoeducation and/or Health Education Patient Self Care Activities & Deficits:  . Patient is unable to independently navigate community resource options without care coordination support . Patient will keep schedule an appointments  . Patient is motivated for treatment . Patient will bring FMLA papers today for PCP to complete ( will leave a front desk) Please see past updates related to this goal by clicking on the "Past Updates" button in the selected goal        Outpatient Encounter Medications as of 04/02/2020  Medication Sig  . albuterol (PROVENTIL) (2.5 MG/3ML) 0.083% nebulizer solution Take 2.5 mg by nebulization every 6 (six) hours as needed for wheezing or shortness of breath.  Chelsea Morse albuterol (VENTOLIN HFA) 108 (90 Base) MCG/ACT inhaler Inhale 2 puffs into the lungs every 4 (four) hours as needed for wheezing or shortness of breath.  Chelsea Morse  budesonide-formoterol (SYMBICORT) 160-4.5 MCG/ACT inhaler Inhale 2 puffs into the lungs 2 (two) times daily.  Chelsea Morse escitalopram (LEXAPRO) 5 MG tablet Take 1 tablet (5 mg total) by mouth daily.  . fluticasone (FLONASE) 50 MCG/ACT  nasal spray Place 1 spray into both nostrils daily.  . Menthol-Camphor (ICY HOT ADVANCED RELIEF) 16-11 % CREA Apply 1 application topically as needed (for back pain).  . montelukast (SINGULAIR) 10 MG tablet Take 10 mg by mouth at bedtime.  . triamcinolone (KENALOG) 0.025 % ointment Apply 1 application topically 2 (two) times daily. (Patient taking differently: Apply 1 application topically 2 (two) times daily as needed (rash/irritation). )   No facility-administered encounter medications on file as of 04/02/2020.   Review of patient status, including review of consultants reports, relevant laboratory and other test results, and collaboration with appropriate care team members and the patient's provider was performed as part of comprehensive patient evaluation and provision of care management services.    Casimer Lanius, [place] / [place]   [phone] 2:25 PM

## 2020-04-09 ENCOUNTER — Telehealth: Payer: Self-pay | Admitting: Family Medicine

## 2020-04-09 NOTE — Telephone Encounter (Signed)
Disability forms completed and placed in front off to e faxed.  Carollee Leitz, MD [facility]

## 2020-04-10 ENCOUNTER — Telehealth: Payer: Self-pay | Admitting: *Deleted

## 2020-04-10 ENCOUNTER — Emergency Department (HOSPITAL_COMMUNITY)
Admission: EM | Admit: 2020-04-10 | Discharge: 2020-04-11 | Disposition: A | Discharge status: Other Acute Inpatient Hospital | Payer: Medicare (Managed Care) | Attending: Emergency Medicine | Admitting: Emergency Medicine

## 2020-04-10 ENCOUNTER — Other Ambulatory Visit: Payer: Self-pay

## 2020-04-10 ENCOUNTER — Encounter (HOSPITAL_COMMUNITY): Payer: Self-pay | Admitting: *Deleted

## 2020-04-10 DIAGNOSIS — Z7951 Long term (current) use of inhaled steroids: Secondary | ICD-10-CM | POA: Diagnosis not present

## 2020-04-10 DIAGNOSIS — R4589 Other symptoms and signs involving emotional state: Secondary | ICD-10-CM | POA: Diagnosis not present

## 2020-04-10 DIAGNOSIS — R45851 Suicidal ideations: Secondary | ICD-10-CM

## 2020-04-10 DIAGNOSIS — F332 Major depressive disorder, recurrent severe without psychotic features: Secondary | ICD-10-CM | POA: Insufficient documentation

## 2020-04-10 DIAGNOSIS — E876 Hypokalemia: Secondary | ICD-10-CM | POA: Diagnosis not present

## 2020-04-10 DIAGNOSIS — Z7289 Other problems related to lifestyle: Secondary | ICD-10-CM | POA: Diagnosis not present

## 2020-04-10 DIAGNOSIS — F4310 Post-traumatic stress disorder, unspecified: Secondary | ICD-10-CM | POA: Insufficient documentation

## 2020-04-10 DIAGNOSIS — Z20822 Contact with and (suspected) exposure to covid-19: Secondary | ICD-10-CM | POA: Diagnosis not present

## 2020-04-10 DIAGNOSIS — J45909 Unspecified asthma, uncomplicated: Secondary | ICD-10-CM | POA: Insufficient documentation

## 2020-04-10 DIAGNOSIS — F1590 Other stimulant use, unspecified, uncomplicated: Secondary | ICD-10-CM | POA: Insufficient documentation

## 2020-04-10 LAB — RAPID URINE DRUG SCREEN, HOSP PERFORMED
Amphetamines: NOT DETECTED
Barbiturates: NOT DETECTED
Benzodiazepines: NOT DETECTED
Cocaine: NOT DETECTED
Opiates: NOT DETECTED
Tetrahydrocannabinol: POSITIVE — AB

## 2020-04-10 LAB — CBC
HCT: 39.9 % (ref 36.0–46.0)
Hemoglobin: 14.2 g/dL (ref 12.0–15.0)
MCH: 27.6 pg (ref 26.0–34.0)
MCHC: 35.6 g/dL (ref 30.0–36.0)
MCV: 77.6 fL — ABNORMAL LOW (ref 80.0–100.0)
Platelets: 246 10*3/uL (ref 150–400)
RBC: 5.14 MIL/uL — ABNORMAL HIGH (ref 3.87–5.11)
RDW: 13.3 % (ref 11.5–15.5)
WBC: 8.3 10*3/uL (ref 4.0–10.5)
nRBC: 0 % (ref 0.0–0.2)

## 2020-04-10 LAB — COMPREHENSIVE METABOLIC PANEL
ALT: 21 U/L (ref 0–44)
AST: 24 U/L (ref 15–41)
Albumin: 4.4 g/dL (ref 3.5–5.0)
Alkaline Phosphatase: 71 U/L (ref 38–126)
Anion gap: 13 (ref 5–15)
BUN: 12 mg/dL (ref 6–20)
CO2: 22 mmol/L (ref 22–32)
Calcium: 9.2 mg/dL (ref 8.9–10.3)
Chloride: 102 mmol/L (ref 98–111)
Creatinine, Ser: 0.85 mg/dL (ref 0.44–1.00)
GFR calc Af Amer: >60 mL/min (ref >60)
GFR calc non Af Amer: >60 mL/min (ref >60)
Glucose, Bld: 90 mg/dL (ref 70–99)
Potassium: 3.2 mmol/L — ABNORMAL LOW (ref 3.5–5.1)
Sodium: 137 mmol/L (ref 135–145)
Total Bilirubin: 1.9 mg/dL — ABNORMAL HIGH (ref 0.3–1.2)
Total Protein: 8.1 g/dL (ref 6.5–8.1)

## 2020-04-10 LAB — RESPIRATORY PANEL BY RT PCR (FLU A&B, COVID)
Influenza A by PCR: NEGATIVE
Influenza B by PCR: NEGATIVE
SARS Coronavirus 2 by RT PCR: NEGATIVE

## 2020-04-10 LAB — SALICYLATE LEVEL: Salicylate Lvl: <7 mg/dL — ABNORMAL LOW (ref 7.0–30.0)

## 2020-04-10 LAB — ACETAMINOPHEN LEVEL: Acetaminophen (Tylenol), Serum: <10 ug/mL — ABNORMAL LOW (ref 10–30)

## 2020-04-10 LAB — ETHANOL: Alcohol, Ethyl (B): <10 mg/dL (ref <10)

## 2020-04-10 MED ORDER — POTASSIUM CHLORIDE CRYS ER 20 MEQ PO TBCR
20.0000 meq | EXTENDED_RELEASE_TABLET | Freq: Two times a day (BID) | ORAL | Status: DC
  Administered 2020-04-10: 20 meq via ORAL
  Filled 2020-04-10: qty 1

## 2020-04-10 MED ORDER — ACETAMINOPHEN 325 MG PO TABS
650.0000 mg | ORAL_TABLET | ORAL | Status: DC | PRN
  Administered 2020-04-10: 650 mg via ORAL
  Filled 2020-04-10: qty 2

## 2020-04-10 MED ORDER — NICOTINE 21 MG/24HR TD PT24: 21.0000 mg | MEDICATED_PATCH | Freq: Every day | TRANSDERMAL | Status: DC

## 2020-04-10 NOTE — Progress Notes (Signed)
Pt accepted to [facility], [place] [place] [address] , [facility] [address].  Patient to be checked in at the [facility] upon arrival.     Dr. Aundra Dubin is the attending provider.    Call report to [phone]    Nurse Rosana Hoes, RN, ED notified.     Pt is IVC.    Pt may be transported by law enforcement  Pt scheduled  to arrive at [place] anytime after 9 am, 04/10/20  Admissions is to contact Nurse [facility], and request IVC paperwork.  [place] Disposition, CSW [phone] (cell)

## 2020-04-10 NOTE — ED Notes (Signed)
Pt sister Israel

## 2020-04-10 NOTE — ED Notes (Signed)
TTS in process-Monique,RN  

## 2020-04-10 NOTE — Progress Notes (Signed)
Per Lindon Romp, FNP patient meets inpatient criteria.  Referral have been faxed to the following facilities:    [facility]        [facility]        [facility]        [facility]        [facility]       CSW will continue to follow up with facilities.  [place] Disposition, CSW [phone] (cell)

## 2020-04-10 NOTE — ED Notes (Signed)
Called pt x3 for VS, no response. Not visualized in lobby at this time.

## 2020-04-10 NOTE — ED Notes (Signed)
When pt is able to take have her call her sister Chelsea Morse  [phone] and pass the message the sister stated she is thinking about her and she's there for her
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
## 2020-04-10 NOTE — ED Notes (Signed)
Pt IVC papers served

## 2020-04-10 NOTE — ED Notes (Signed)
Patient at RN station making phone call to [facility]

## 2020-04-10 NOTE — Telephone Encounter (Signed)
My Chart message sent letting pt know forms have been faxed.Chelsea Morse, CMA

## 2020-04-11 ENCOUNTER — Telehealth: Payer: Medicare (Managed Care)

## 2020-04-11 NOTE — ED Notes (Signed)
Pt using phone to call bf.

## 2020-04-11 NOTE — ED Notes (Signed)
Multiple attempt to give report at receiving facility. Pt left Chelsea Morse with sheriff at 11:37.

## 2020-04-11 NOTE — ED Notes (Signed)
Pt without complaint. Showered and changed clothes. Aware she is going to [facility] after 0930. Pleasant and helpful cleaning her room up.

## 2020-04-11 NOTE — ED Notes (Signed)
Faxed IVC papers to [facility]-Monique,RN

## 2020-04-15 ENCOUNTER — Telehealth: Payer: Self-pay

## 2020-04-15 NOTE — Telephone Encounter (Signed)
Patient calls nurse line from [facility]. Patient is requesting an extension of medical LOA for three weeks. Patient reports being admitted to facility for SI and self harm.   If you need to contact patient to discuss in further detail she can be reached at [phone], code [phone]. She reports that she will be in facility until at least Thursday 10/7.   To PCP  Talbot Grumbling, RN

## 2020-04-17 ENCOUNTER — Other Ambulatory Visit: Payer: Self-pay | Admitting: Family Medicine

## 2020-04-17 NOTE — Telephone Encounter (Signed)
Attempted to call patient x 2, was unsuccessful and voicemail not available. Will attempt again tomorrow.

## 2020-04-21 NOTE — Telephone Encounter (Signed)
Patient calling again to check the status of medical LOA.

## 2020-04-22 NOTE — Telephone Encounter (Signed)
Called the number provided.  Was told that they could neither confirm or deny patient was there.  Left message for patient to call clinic to speak with me.  If patient calls let her know that she should be able to get a letter from the provider there to extend her LOA, if not I will need to talk to her  to complete forms.  Thank you Lavella Lemons

## 2020-04-25 NOTE — Telephone Encounter (Signed)
Pt has appointment with PCP on 05/01/2020. Zarek Relph Zimmerman Rumple, CMA

## 2020-04-28 NOTE — Patient Instructions (Addendum)
Thank you for coming to see me today. It was a pleasure.    Please follow-up with me in 3 weeks  If you have any questions or concerns, please do not hesitate to call the office at [phone].  Best,   Carollee Leitz, [place] Providers (No Insurance required or Self Pay)  [facility]  [address] [place], [place] Crisis [phone]  [facility]) can see uninsured folks for outpatient therapy [email or web address]-[email or web address] [address] [place], [place] [address] [phone]  [place] Mon-Fri, 8am-3pm [email or web address], [place], [place]   [place] [phone]- [facility] for psych med management, there may be a wait- if [facility] is working with clients for OPT, they will coordinate with [place] for [place]   Walk-in-Clinic: Monday- Friday 9:00 AM - 4:00 PM [place], [place] [phone]  [facility] [place] ([employer]) walk in M-F 8am-12pm and  1pm-3pm [place]  [place]  Phone: [phone]  [employer] ([place] and substance challenges) [address], [place] [phone]    [email or web address] of the [place], [place]     Phone:  [phone] [place]  [place]  [phone] [email or web address].gl   [facility] ( virtual or zoom therapy) strongminds@[employer] .edu  [place]  [place]    [facility] [phone]  grief counseling, dementia and caregiver support    [facility] Walk-in MWF 12:30 to  3:00     [place] [address]  [phone]  www.ADSyes.org call to schedule an appointment    [place] ,Support group, Peer support services, [address], [place], [place] [address] 336- [phone]  http://www.kerr.[email or web address]/           National Alliance on Mental Illness (NAMI) [place]- Wellness classes, Support groups        [address] [place], [place] [address] [phone]   CurrentJokes.cz   [facility]  (Psycho-social Rehabilitation clubhouse, Individual and group therapy) [address] [place], [place] [address]   [phone]  24- Hour Availability:  *[place] or 1-7691144767 * [facility] the [employer] (Domestic Violence, Rape, etc. )[phone] [place] [phone] or [phone] * [place] [phone] only) [phone] (after hours) *[facility] Mobile Crisis Unit [phone] *[place] National Suicide Hotline 3528157625 Diamantina Monks)
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
## 2020-05-01 ENCOUNTER — Other Ambulatory Visit: Payer: Self-pay

## 2020-05-01 ENCOUNTER — Encounter: Payer: Self-pay | Admitting: Family Medicine

## 2020-05-01 ENCOUNTER — Ambulatory Visit (INDEPENDENT_AMBULATORY_CARE_PROVIDER_SITE_OTHER): Payer: Medicare (Managed Care) | Admitting: Family Medicine

## 2020-05-01 DIAGNOSIS — F339 Major depressive disorder, recurrent, unspecified: Secondary | ICD-10-CM

## 2020-05-02 ENCOUNTER — Telehealth: Payer: Self-pay | Admitting: Licensed Clinical Social Worker

## 2020-05-02 NOTE — Chronic Care Management (AMB) (Signed)
    Clinical Social Work  Care Management Unsuccessful Outreach   05/02/2020 Name: Chelsea Morse MRN: [id number] DOB: 1970/07/17  Chelsea Morse is a 49 y.o. year old female who is a primary care patient of Carollee Leitz, MD .   F/U phone call to [name] today to assess needs, and progress with connecting for counseling.  The outreach was unsuccessful.  A HIPPA compliant phone message was left for the patient providing contact information and requesting a return call.   Plan: If no return call is received, will call again in 5 to 7 days.  Review of patient status, including review of consultants reports, relevant laboratory and other test results, and collaboration with appropriate care team members and the patient's provider was performed as part of comprehensive patient evaluation and provision of care management services.     Casimer Lanius, [place] / [place]   [phone] 1:16 PM

## 2020-05-03 ENCOUNTER — Encounter: Payer: Self-pay | Admitting: Family Medicine

## 2020-05-03 MED ORDER — ESCITALOPRAM OXALATE 10 MG PO TABS: 10.0000 mg | ORAL_TABLET | Freq: Every day | ORAL | 0 refills | Status: DC

## 2020-05-03 NOTE — Assessment & Plan Note (Addendum)
PHQ reviewed and has decreased significantly.   -Continue Lexapro 10 mg daily -Continue Atarax as needed -Follow up with psychiatry as scheduled -Continue behavorial therapy as scheduled -Safety precautions reviewed, and she plans to go to [facility] if feels unsafe. -Crisis line provided -Follow up in 3 weeks.  Patient has appointment scheduled for Nov 5 with different provider.  She will keep this appointment for now but will cancel closer to that time if does not need.  I have booked an appointment with me for Nov 10.

## 2020-05-05 ENCOUNTER — Encounter: Payer: Self-pay | Admitting: Family Medicine

## 2020-05-12 ENCOUNTER — Encounter (HOSPITAL_COMMUNITY): Payer: Self-pay | Admitting: Emergency Medicine

## 2020-05-12 ENCOUNTER — Ambulatory Visit (HOSPITAL_COMMUNITY)
Admission: EM | Admit: 2020-05-12 | Discharge: 2020-05-12 | Disposition: A | Discharge status: Home / Self Care | Payer: Medicare (Managed Care) | Attending: Family Medicine | Admitting: Family Medicine

## 2020-05-12 ENCOUNTER — Other Ambulatory Visit: Payer: Self-pay

## 2020-05-12 DIAGNOSIS — J4541 Moderate persistent asthma with (acute) exacerbation: Secondary | ICD-10-CM | POA: Diagnosis not present

## 2020-05-12 DIAGNOSIS — J209 Acute bronchitis, unspecified: Secondary | ICD-10-CM

## 2020-05-12 MED ORDER — PROMETHAZINE-DM 6.25-15 MG/5ML PO SYRP: 5.0000 mL | ORAL_SOLUTION | Freq: Four times a day (QID) | ORAL | 0 refills | Status: DC | PRN

## 2020-05-12 MED ORDER — PREDNISONE 10 MG PO TABS: ORAL_TABLET | ORAL | 0 refills | Status: DC

## 2020-05-12 MED ORDER — AZITHROMYCIN 250 MG PO TABS: ORAL_TABLET | ORAL | 0 refills | Status: DC

## 2020-05-12 NOTE — ED Triage Notes (Signed)
Pt c/o cough onset Sept 30th. Pt states she is wheezing a lot and has chest congestion. Pt states sometimes the cough is productive with yellowish Afomia Blackley mucus. Pt states she has been tested for covid and it was negative. Pt states she has been using her inhaler. Pt denies fever.

## 2020-05-12 NOTE — ED Provider Notes (Signed)
[place]    CSN: [id number] Arrival date & time: 05/12/20  7035      History   Chief Complaint Chief Complaint  Patient presents with  . Cough    HPI Chelsea Morse is a 49 y.o. female.   Patient presenting today with about a month of productive cough, wheezing, chest tightness, fatigue. States she got sick while admitted to [facility] and never got better. Was tested for COVID initially which was negative. Denies fever, chills, CP, abdominal pain, N/V/D, rash. Has been using her singulair, flonase, symbicort, and albuterol without much benefit. Hx of COPD and asthma per patient. Denies new sick contacts, has been staying at home since onset.      Past Medical History:  Diagnosis Date  . Anemia    no meds  . Anxiety   . Arthritis   . Asthma   . Chronic fatigue   . Depression    w PTSD  . Depression   . Environmental and seasonal allergies   . Hypoglycemia   . Sickle cell trait [facility])     Patient Active Problem List   Diagnosis Date Noted  . Chronic pain 02/08/2020  . Routine check-up 02/08/2020  . Insomnia 11/23/2019  . Depression, recurrent ([place]) 02/06/2019  . Osteoarthritis of spine at multiple levels 02/06/2019  . Anemia 02/06/2019  . Asthma 02/06/2019  . Tobacco use disorder 02/06/2019  . Sickle cell trait ([place]) 02/06/2019  . S/P hysterectomy 02/06/2019    Past Surgical History:  Procedure Laterality Date  . ABDOMINAL HYSTERECTOMY  2015   partial hysterectomy  . ACROMIO-CLAVICULAR JOINT REPAIR Left 12/25/2018   Procedure: LEFT ACROMIO-CLAVICULAR JOINT RECONSTRUCTION WITH ALLOGRAFT;  Surgeon: Tania Ade, MD;  Location: [facility];  Service: Orthopedics;  Laterality: Left;  . CHOLECYSTECTOMY  02/17/2000  . COLONOSCOPY     age 34   . KNEE ARTHROSCOPY Right    x3  . ORTHOPEDIC SURGERY    . SHOULDER ARTHROSCOPY Bilateral   . TUBAL LIGATION  1994  . WRIST GANGLION EXCISION Bilateral    right x2    OB  History   No obstetric history on file.      Home Medications    Prior to Admission medications   Medication Sig Start Date End Date Taking? Authorizing Provider  albuterol (PROVENTIL) (2.5 MG/3ML) 0.083% nebulizer solution Take 2.5 mg by nebulization every 6 (six) hours as needed for wheezing or shortness of breath.   Yes [provider]  albuterol (VENTOLIN HFA) 108 (90 Base) MCG/ACT inhaler Inhale 2 puffs into the lungs every 4 (four) hours as needed for wheezing or shortness of breath.   Yes [provider]  budesonide-formoterol (SYMBICORT) 160-4.5 MCG/ACT inhaler Inhale 2 puffs into the lungs 2 (two) times daily.   Yes [provider]  escitalopram (LEXAPRO) 10 MG tablet Take 1 tablet (10 mg total) by mouth daily. 05/03/20  Yes Carollee Leitz, MD  fluticasone (FLONASE) 50 MCG/ACT nasal spray Place 1 spray into both nostrils daily.   Yes [provider]  Menthol-Camphor (ICY HOT ADVANCED RELIEF) 16-11 % CREA Apply 1 application topically as needed (for back pain).   Yes [provider]  montelukast (SINGULAIR) 10 MG tablet Take 10 mg by mouth at bedtime.   Yes [provider]  triamcinolone (KENALOG) 0.025 % ointment Apply 1 application topically 2 (two) times daily. Patient taking differently: Apply 1 application topically 2 (two) times daily as needed (rash/irritation).  05/26/19  Yes  Bjorn Pippin, PA-C  azithromycin (ZITHROMAX) 250 MG tablet Take 2 tabs day one, then 1 tab daily until complete 05/12/20   Volney American, PA-C  predniSONE (DELTASONE) 10 MG tablet Take 6 tabs day one, 5 tabs day two, 4 tabs day three, etc 05/12/20   Volney American, PA-C  promethazine-dextromethorphan (PROMETHAZINE-DM) 6.25-15 MG/5ML syrup Take 5 mLs by mouth 4 (four) times daily as needed for cough. 05/12/20   Volney American, PA-C    Family History Family History  Problem Relation Age of Onset  . Drug abuse Mother   .  Alcohol abuse Mother   . Depression Mother   . Diabetes Mother   . Healthy Father   . Depression Sister   . Hypertension Sister   . Drug abuse Sister   . Drug abuse Brother   . Alcohol abuse Brother   . Hypertension Brother   . Depression Brother   . Sickle cell anemia Brother   . Colon cancer Neg Hx   . Stomach cancer Neg Hx   . Pancreatic cancer Neg Hx   . Esophageal cancer Neg Hx     Social History Social History   Tobacco Use  . Smoking status: Current Some Day Smoker    Packs/day: 0.25  . Smokeless tobacco: Never Used  Vaping Use  . Vaping Use: Never used  Substance Use Topics  . Alcohol use: Never  . Drug use: Yes    Types: Marijuana    Comment: daily at bedtime     Allergies   Asa [aspirin]   Review of Systems Review of Systems PER HPI    Physical Exam Triage Vital Signs ED Triage Vitals  Enc Vitals Group     BP 05/12/20 0905 121/64     Pulse Rate 05/12/20 0905 68     Resp 05/12/20 0905 18     Temp 05/12/20 0905 98.4 F (36.9 C)     Temp Source 05/12/20 0905 Oral     SpO2 05/12/20 0905 99 %     Weight --      Height --      Head Circumference --      Peak Flow --      Pain Score 05/12/20 0902 6     Pain Loc --      Pain Edu? --      Excl. in [place]? --    No data found.  Updated Vital Signs BP 121/64 (BP Location: Right Arm)   Pulse 68   Temp 98.4 F (36.9 C) (Oral)   Resp 18   SpO2 99%   Visual Acuity Right Eye Distance:   Left Eye Distance:   Bilateral Distance:    Right Eye Near:   Left Eye Near:    Bilateral Near:     Physical Exam Vitals and nursing note reviewed.  Constitutional:      General: She is not in acute distress.    Appearance: Normal appearance. She is not ill-appearing.  HENT:     Head: Atraumatic.     Right Ear: Tympanic membrane normal.     Left Ear: Tympanic membrane normal.     Nose: Rhinorrhea present.     Mouth/Throat:     Mouth: Mucous membranes are moist.     Pharynx: Posterior oropharyngeal  erythema present.  Eyes:     Extraocular Movements: Extraocular movements intact.     Conjunctiva/sclera: Conjunctivae normal.  Cardiovascular:     Rate and Rhythm: Normal rate  and regular rhythm.     Heart sounds: Normal heart sounds.  Pulmonary:     Effort: Pulmonary effort is normal.     Breath sounds: Wheezing (mild scattered) present. No rales.  Abdominal:     General: Bowel sounds are normal.     Palpations: Abdomen is soft.     Tenderness: There is no abdominal tenderness. There is no guarding.  Musculoskeletal:        General: Normal range of motion.     Cervical back: Normal range of motion and neck supple.  Skin:    General: Skin is warm and dry.  Neurological:     Mental Status: She is alert and oriented to person, place, and time.  Psychiatric:        Mood and Affect: Mood normal.        Thought Content: Thought content normal.        Judgment: Judgment normal.      UC Treatments / Results  Labs (all labs ordered are listed, but only abnormal results are displayed) Labs Reviewed - No data to display  EKG   Radiology No results found.  Procedures Procedures (including critical care time)  Medications Ordered in UC Medications - No data to display  Initial Impression / Assessment and Plan / UC Course  I have reviewed the triage vital signs and the nursing notes.  Pertinent labs & imaging results that were available during my care of the patient were reviewed by me and considered in my medical decision making (see chart for details).     Consistent with bronchitis/asthma exacerbation picture. Given her hx of pneumonia and duration of illness, will cover with zpak and also treat with prednisone taper, phenergan DM, mucinex, and continued asthma and allergy regimen. OTC supportive care reviewed. F/u if worsening or not resolving.   Final Clinical Impressions(s) / UC Diagnoses   Final diagnoses:  Moderate persistent asthma with exacerbation  Acute  bronchitis, unspecified organism   Discharge Instructions   None    ED Prescriptions    Medication Sig Dispense Auth. Provider   promethazine-dextromethorphan (PROMETHAZINE-DM) 6.25-15 MG/5ML syrup Take 5 mLs by mouth 4 (four) times daily as needed for cough. 100 mL Volney American, PA-C   predniSONE (DELTASONE) 10 MG tablet Take 6 tabs day one, 5 tabs day two, 4 tabs day three, etc 21 tablet Volney American, PA-C   azithromycin (ZITHROMAX) 250 MG tablet Take 2 tabs day one, then 1 tab daily until complete 6 tablet Volney American, PA-C     PDMP not reviewed this encounter.   Merrie Roof [place], [place] 05/12/20 [phone]

## 2020-05-16 ENCOUNTER — Ambulatory Visit: Payer: Medicare (Managed Care) | Admitting: Family Medicine

## 2020-05-16 NOTE — Progress Notes (Deleted)
    SUBJECTIVE:   CHIEF COMPLAINT / HPI:   Depression F/U ***  PERTINENT  PMH / PSH: ***  OBJECTIVE:   There were no vitals taken for this visit.   Physical Exam: *** General: 49 y.o. female in NAD Cardio: RRR no m/r/g Lungs: CTAB, no wheezing, no rhonchi, no crackles, no IWOB on *** Abdomen: Soft, non-tender to palpation, non-distended, positive bowel sounds Skin: warm and dry Extremities: No edema   ASSESSMENT/PLAN:   No problem-specific Assessment & Plan notes found for this encounter.     Cleophas Dunker, [place]

## 2020-05-18 NOTE — Patient Instructions (Signed)
Thank you for coming to see me today. It was a pleasure.   Please follow-up with 4 weeks  If you have any questions or concerns, please do not hesitate to call the office at [phone].  Best,   Carollee Leitz, [place] Providers (No Insurance required or Self Pay)  [facility]  [address] [place], [place] Crisis [phone]  [facility]) can see uninsured folks for outpatient therapy [email or web address]-[email or web address] [address] [place], [place] [address] [phone]  [place] Mon-Fri, 8am-3pm [email or web address], [place], [place]   [place] [phone]- [facility] for psych med management, there may be a wait- if [facility] is working with clients for OPT, they will coordinate with [place] for [place]   Walk-in-Clinic: Monday- Friday 9:00 AM - 4:00 PM [place], [place] [phone]  [facility] [place] ([employer]) walk in M-F 8am-12pm and  1pm-3pm [place]  [place]  Phone: [phone]  [employer] ([place] and substance challenges) [address], [place] [phone]    [email or web address] of the [place], [place]     Phone:  [phone] [place]  [place]  [place]  [phone] TransportationAnalyst.gl   [facility] ( virtual or zoom therapy) [email or web address]  [place]  [place]    [facility] [phone]  grief counseling, dementia and caregiver support    [facility] Walk-in MWF 12:30 to 3:00      [place] [address]  [phone]  www.ADSyes.org call to schedule an appointment    [place] ,Support group, Peer support services, [address] [place], [place] [address] [phone]- [phone]  [email or web address]           National [facility] on Mental Illness (NAMI) [place]- Wellness classes, Support groups        [address], [place], [place] [address] [phone]   CurrentJokes.cz   [facility]  (Psycho-social Rehabilitation clubhouse, Individual and group therapy) [address], [place] [address]   [phone]  24- Hour Availability:  *[place] or [phone] * [facility] [employer] (Domestic Violence, Rape, etc. )[phone] [place] [phone] or [phone] * [place] [phone] only) [phone] (after hours) *[facility] Mobile Crisis Unit [phone] *[place] National Suicide Hotline [phone] Diamantina Monks)

## 2020-05-18 NOTE — Progress Notes (Signed)
° ° °  SUBJECTIVE:   CHIEF COMPLAINT / HPI: f/u depression  Depression Continues to take Lexapro 5mg  two tabs.  Financially difficult to obtain the 10mg  tablets. Tolerating meds well.  Denies any SI/HI.  Continues to see therapist and reports has been helping.  She has an appointment with psychiatrist 11/19, who will also complete her FMLA forms.    Chronic Bronchitis Reports history of Asthma/COPD/Bronchitis.  Was recently seen in ED and treated with Pred Pack and Zithromax.  Reports she was supposed to be seen by Pulmonology in [place] but had moved here. Is now requesting referral.   PERTINENT  PMH / PSH:  PTSD/Anxiety/Depression/Suicide Attempts H/O Bronchitis/COPD/Asthma Tobacco use  OBJECTIVE:   BP (!) 92/50    Pulse 78    Ht 5\' 6"  (1.676 m)    Wt 136 lb 12.8 oz (62.1 kg)    SpO2 98%    BMI 22.08 kg/m    General: Alert and oriented, no apparent distress  Cardiovascular: RRR with no murmurs noted Respiratory: CTA bilaterally  Psych: Behavior and speech appropriate to situation, No SI/HI  ASSESSMENT/PLAN:   Depression, recurrent ([place]) Doing well.  PHQ 9 reviewed.  Has been seeing therapist -Follow up with therapy as scheduled -Follow up with psychiatry 11/19 as scheduled -Continue Lexapro 5 mg 2 tabs daily -Follow up in 4 weeks -Crisis line provided  Asthma Recently seen in ED and treated with steroids and antibiotics.  Has never had PFTS's for evaluation of underlying lung disease -Appointment with Dr Valentina Lucks for tomorrow for PFT's -Nebulizer machine at patients request as her old one is not working -Encourage smoking cessation and I-Quit number provided -Follow up as needed     Chelsea Leitz, MD [place]

## 2020-05-20 ENCOUNTER — Telehealth: Payer: Self-pay | Admitting: Licensed Clinical Social Worker

## 2020-05-20 NOTE — Chronic Care Management (AMB) (Signed)
    Clinical Social Work  Care Management Outreach   05/20/2020 Name: Chelsea Morse MRN: [id number] DOB: [birth year]  [name] is a 49 y.o. year old female who is a primary care patient of Carollee Leitz, MD .   F/U phone call to [name] today to assess needs, and progress with connecting for counseling.  The outreach was unsuccessful. A HIPPA compliant phone message was left for the patient providing contact information and requesting a return call.   Plan: If no return call is received, will call again in 7 to 10 days.  Review of patient status, including review of consultants reports, relevant laboratory and other test results, and collaboration with appropriate care team members and the patient's provider was performed as part of comprehensive patient evaluation and provision of care management services.     Casimer Lanius, [place] / [place]   [phone] 2:15 PM

## 2020-05-21 ENCOUNTER — Ambulatory Visit (INDEPENDENT_AMBULATORY_CARE_PROVIDER_SITE_OTHER): Payer: Medicare (Managed Care) | Admitting: Family Medicine

## 2020-05-21 ENCOUNTER — Other Ambulatory Visit: Payer: Self-pay

## 2020-05-21 ENCOUNTER — Encounter: Payer: Self-pay | Admitting: Family Medicine

## 2020-05-21 VITALS — BP 92/50 | HR 78 | Ht 66.0 in | Wt 136.8 lb

## 2020-05-21 DIAGNOSIS — J4520 Mild intermittent asthma, uncomplicated: Secondary | ICD-10-CM | POA: Diagnosis not present

## 2020-05-21 DIAGNOSIS — F339 Major depressive disorder, recurrent, unspecified: Secondary | ICD-10-CM

## 2020-05-21 MED ORDER — ESCITALOPRAM OXALATE 5 MG PO TABS: 10.0000 mg | ORAL_TABLET | Freq: Every day | ORAL | 0 refills | Status: DC

## 2020-05-22 ENCOUNTER — Encounter: Payer: Self-pay | Admitting: Pharmacist

## 2020-05-22 ENCOUNTER — Ambulatory Visit (INDEPENDENT_AMBULATORY_CARE_PROVIDER_SITE_OTHER): Payer: Medicare (Managed Care) | Admitting: Pharmacist

## 2020-05-22 DIAGNOSIS — J4520 Mild intermittent asthma, uncomplicated: Secondary | ICD-10-CM | POA: Diagnosis not present

## 2020-05-22 DIAGNOSIS — F17200 Nicotine dependence, unspecified, uncomplicated: Secondary | ICD-10-CM

## 2020-05-22 NOTE — Progress Notes (Signed)
S:    Patient arrives slightly anxious but otherwise in good spirits.    Presents for lung function evaluation.   Patient was referred and last seen by Primary Care Provider, Dr. Volanda Napoleon on 05/20/2020.    Patient reports breathing has been a 6/10 during the day with improvement at night. Endorses ~1 month history of increased difficulty breathing 2/2 pneumonia for which she completed treatment with antibiotics and steroids.   Endorses smoking black and milds throughout the week. Primary smoking trigger is stress, due to her social situation as well as smoking with her partner. Patient also smokes marijuana nightly to help sleep and deal with stress.   Patient reports adherence to medications. Patient reports last dose of asthma medications was yesterday, 05/22/2020.  Current asthma medications: montelukast 10 mg QHS, budesonide/formoterol 160-4.5 mcg/act 2 puffs daily, albuterol inhaler 2 puffs Q4h PRN, albuterol nebulized 2.5 mg Q6h PRN.  Rescue inhaler use frequency: albuterol inhaler used BID (morning and night) and nebulized albuterol nightly.   Level of asthma sx control- in the last 4 weeks: Question Scoring Patient Score  Daytime sx more than twice a week Yes (1) No (0) 1  Any nighttime waking due to asthma Yes (1) No (0) 0  Reliever needed >2x/week Yes (1) No (0) 1  Any activity limitation due to asthma Yes (1) No (0) 1   Total Score   Well controlled - 0, partly controlled - 1-2, uncontrolled 3-4  Patient verbalized long-term stress related to social living situation.     O: Physical Exam Constitutional:      Appearance: Normal appearance. She is normal weight.  Pulmonary:     Effort: Pulmonary effort is normal.  Neurological:     Mental Status: She is alert.    Review of Systems  Respiratory: Positive for wheezing.   Psychiatric/Behavioral: Positive for substance abuse and suicidal ideas. The patient is nervous/anxious.        She manages stress and suicidal  thought and PTSD with marijuana.  Reports handling this with psychotherapy.   All other systems reviewed and are negative.    Vitals:   05/22/20 1532  BP: 110/70  Pulse: 71  SpO2: 98%     See "scanned report" or Documentation Flowsheet (discrete results - PFTs) for  Spirometry results. Patient provided good effort while attempting spirometry.   Lung Age = 89  A/P: Patient has been experiencing increased difficulty breathing and cough for ~ 1 month 2/2 pneumonia for which she completed a course of antibiotics and steroids. Cough has lingered and patient is still experiencing some increased breathing difficulty from baseline. Spirometry evaluation reveals normal lung function with no signs of obstruction or restriction. No nebulizer treatment was administered. Informed patient of spirometry results and made no changes to treatment given normal lung function. Informed patient that cough may linger for a coupe of months after resolution of pneumonia, and to avoid triggers as much as possible.   - No change in inhalation therapy.   - Continue Symbicort, montelukast and albuterol.   Intermittent tobacco/marijuana use over the last 15 years with multiple periods of abstinence from tobacco.  Relapses related to stress.  -Educated patient on importance of smoking cessation and patient expressed interest in starting a nicotine patch. Patient was given resources for [place] quitline and informed to use the 7 mg patch. Educated patient about symptoms of patch including site irritation, insomnia, abnormal dreams and told her she should rotate the site and use hydrocortisone cream if  needed as well as removing the patch an hour before bed. Further educated about avoiding stressors and behaviors to employ when she feels the urge to smoke.   Patient verbalized understanding of results and education.  Written pt instructions provided.   F/U with PCP, Dr. Volanda Napoleon.   Total time in face to face counseling 30 minutes.   Patient seen with Adria Dill, PharmD Candidate, Dimple Nanas, PharmD - PGY-1 Resident and Lorel Monaco, PharmD, PGY2 Pharmacy Resident.   Marland Kitchen

## 2020-05-22 NOTE — Assessment & Plan Note (Signed)
Patient has been experiencing increased difficulty breathing and cough for ~ 1 month 2/2 pneumonia for which she completed a course of antibiotics and steroids. Cough has lingered and patient is still experiencing some increased breathing difficulty from baseline. Spirometry evaluation reveals normal lung function with no signs of obstruction or restriction. No nebulizer treatment was administered. Informed patient of spirometry results and made no changes to treatment given normal lung function. Informed patient that cough may linger for a coupe of months after resolution of pneumonia, and to avoid triggers as much as possible.   - No change in inhalation therapy.   - Continue Symbicort, montelukast and albuterol.

## 2020-05-22 NOTE — Patient Instructions (Signed)
Great to meet you today.   Spirometry was found to be normal.   Continue same medication regimen, singulair, albuterol and symbicort.    Nicotine patch 7mg  once daily.   Apply once daily in the AM.  Apply to upper body and rotate sites.  If you get any skin irritation apply steroid cream to the site.    Try to avoid triggers.   Stay positive.

## 2020-05-22 NOTE — Assessment & Plan Note (Signed)
Intermittent tobacco/marijuana use over the last 15 years with multiple periods of abstinence from tobacco.  Relapses related to stress.  -Educated patient on importance of smoking cessation and patient expressed interest in starting a nicotine patch. Patient was given resources for [place] quitline and informed to use the 7 mg patch. Educated patient about symptoms of patch including site irritation, insomnia, abnormal dreams and told her she should rotate the site and use hydrocortisone cream if needed as well as removing the patch an hour before bed. Further educated about avoiding stressors and behaviors to employ when she feels the urge to smoke.

## 2020-05-23 ENCOUNTER — Encounter: Payer: Self-pay | Admitting: Family Medicine

## 2020-05-23 NOTE — Assessment & Plan Note (Signed)
Recently seen in ED and treated with steroids and antibiotics.  Has never had PFTS's for evaluation of underlying lung disease -Appointment with Dr Valentina Lucks for tomorrow for PFT's -Nebulizer machine at patients request as her old one is not working -Encourage smoking cessation and I-Quit number provided -Follow up as needed

## 2020-05-23 NOTE — Assessment & Plan Note (Signed)
Doing well.  PHQ 9 reviewed.  Has been seeing therapist -Follow up with therapy as scheduled -Follow up with psychiatry 11/19 as scheduled -Continue Lexapro 5 mg 2 tabs daily -Follow up in 4 weeks -Crisis line provided

## 2020-05-23 NOTE — Progress Notes (Signed)
Reviewed: I agree with Dr. Koval's documentation and management. 

## 2020-06-03 ENCOUNTER — Telehealth: Payer: Self-pay | Admitting: Licensed Clinical Social Worker

## 2020-06-03 ENCOUNTER — Ambulatory Visit: Payer: Medicare (Managed Care) | Admitting: Licensed Clinical Social Worker

## 2020-06-03 DIAGNOSIS — Z7189 Other specified counseling: Secondary | ICD-10-CM

## 2020-06-03 NOTE — Patient Instructions (Signed)
  Chelsea Morse  it was nice speaking with you. Please call me directly [phone] if you have questions about the goals we discussed. Goals Addressed            This Visit's Progress   . Connect for therapy and medication evaluation   On track    Patient Self Care Activities:  . F/u with family services for counseling, Do walk in as we discussed if needed       Chelsea Morse received Care Management services today:  1. Care Management services include personalized support from designated clinical staff supervised by her physician, including individualized plan of care and coordination with other care providers 2. 24/7 contact [phone] for assistance for urgent and routine care needs. 3. Care Management are voluntary services and be declined at any time by calling the office. Patient verbalizes understanding of instructions provided today.    Follow up plan: SW will follow up with patient by phone over the next 2 to 3 weeks  Maurine Cane, LCSW

## 2020-06-03 NOTE — Chronic Care Management (AMB) (Signed)
    Clinical Social Work  Care Management Outreach   06/03/2020 Name: Chelsea Morse MRN: [id number] DOB: [birth year]  Chelsea Morse is a 49 y.o. year old female who is a primary care patient of Carollee Leitz, MD .   F/U phone call to Chelsea Morse today to assess needs, and progress with care plan goals.  The outreach was unsuccessful.  Unable to leave a HIPPA compliant phone message phone.   Plan: If no return call  will call again Review of patient status, including review of consultants reports, relevant laboratory and other test results, and collaboration with appropriate care team members and the patient's provider was performed as part of comprehensive patient evaluation and provision of care management services.    Casimer Lanius, [place] / [place]   [phone]

## 2020-06-03 NOTE — Chronic Care Management (AMB) (Signed)
Care Management   Follow Up Clinical Social Work General Note  06/03/2020 Name: Chelsea Morse MRN: [id number] DOB: Sep 07, 1970  Chelsea Morse is enrolled in a Chelsea Morse plan: No. Outreach attempt today was successful.   Chelsea Morse is a 49 y.o. year old female who is a primary care patient of Chelsea Leitz, MD. The Care Management team was consulted to assist the patient with Mental Health Counseling and Resources.   Assessment: Patient is doing better with managing her mood. She is scheduled to return to work tomorrow and not sure how things will go. States she connected with her counselor at [facility] and psychiatrist for review and evaluation of medication.  Current therapist at [facility] has referred her to [facility], she has expressed an interest in EMDR treatment for treating PTSD and has started the intake process with Chelsea Morse.   Chelsea Morse would like to discuss options of getting and MRI with PCP during next office visit.  SDOH (Social Determinants of Health) screening performed today: No  Advanced Directives Status: Not discussed in this encounter   Patient Care Plan: Social Work  Problem Identified: Symptoms (Depression)   Long-Range Goal: Symptoms Monitored and Chelsea   Start Date: 06/03/2020  Expected End Date: 08/11/2020  This Visit's Progress: On track  Priority: High  Note:   Current Barriers:  . Patient with Anxiety and Depression acknowledges deficits with connecting to mental health provider for therapy and medication management.  . Patient is experiencing symptoms of anxiety and mood swings which seem to be exacerbated by balancing work and relationship stressors.     . Patient needs Support, Education, and Care Coordination in order to meet unmet mental health needs  Clinical Social Work Goal(s):  Marland Kitchen Over the next 30 days, patient will continue to reduce or manage symptoms of anxiety, depression, and  mood instability until connected for ongoing counseling with family services.  Interventions:  . Assessed patient's, needs coping skills and barriers to care . Reviewed mental health medications with patient prescribed by PCP and discussed compliance. . Other interventions include: Motivational Interviewing ;Psychoeducation and/or Health Education Patient Self Care Activities:  . F/u with family services for counseling  . Do walk in as we discussed if needed Plan:  LCSW will f/u in 2 to 3 weeks as needed     Outpatient Encounter Medications as of 06/03/2020  Medication Sig  . albuterol (PROVENTIL) (2.5 MG/3ML) 0.083% nebulizer solution Take 2.5 mg by nebulization every 6 (six) hours as needed for wheezing or shortness of breath.  Marland Kitchen albuterol (VENTOLIN HFA) 108 (90 Base) MCG/ACT inhaler Inhale 2 puffs into the lungs every 4 (four) hours as needed for wheezing or shortness of breath.  . budesonide-formoterol (SYMBICORT) 160-4.5 MCG/ACT inhaler Inhale 2 puffs into the lungs 2 (two) times daily.  Marland Kitchen escitalopram (LEXAPRO) 5 MG tablet Take 2 tablets (10 mg total) by mouth daily.  . fluticasone (FLONASE) 50 MCG/ACT nasal spray Place 1 spray into both nostrils daily.  . Menthol-Camphor (ICY HOT ADVANCED RELIEF) 16-11 % CREA Apply 1 application topically as needed (for back pain).  . montelukast (SINGULAIR) 10 MG tablet Take 10 mg by mouth at bedtime.  . triamcinolone (KENALOG) 0.025 % ointment Apply 1 application topically 2 (two) times daily. (Patient taking differently: Apply 1 application topically 2 (two) times daily as needed (rash/irritation). )   No facility-administered encounter medications on file as of 06/03/2020.    [name] was given information  about Care Management services today including:  1. Care Management services include personalized support from designated clinical staff supervised by her physician, including individualized plan of care and coordination with other care  providers 2. 24/7 contact phone numbers for assistance for urgent and routine care needs. 3. The patient may stop care management services at any time (effective at the end of the month) by phone call to the office staff.  Patient agreed to services and verbal consent obtained.   Maurine Cane, LCSW

## 2020-06-16 ENCOUNTER — Ambulatory Visit: Payer: Medicare (Managed Care) | Admitting: Family Medicine

## 2020-07-01 ENCOUNTER — Telehealth: Payer: Self-pay | Admitting: Licensed Clinical Social Worker

## 2020-07-01 NOTE — Chronic Care Management (AMB) (Signed)
    Clinical Social Work  Care Management  Unsuccessful Phone Outreach    07/01/2020 Name: Daveda Larock MRN: [id number] DOB: [birth year]  Phillips Odor Morris is a 49 y.o. year old female who is a primary care patient of Carollee Leitz, MD . Patient is enrolled in a Managed [place]: No  F/U phone call to [name] today to assess needs, and progress with care plan goals.  Unable to leave a HIPPA compliant phone message due to call not able to be completed/ no answer.  Plan: LCSW will attempt another out reach in about 2 weeks ( sooner if possible)  Review of patient status, including review of consultants reports, relevant laboratory and other test results, and collaboration with appropriate care team members and the patient's provider was performed as part of comprehensive patient evaluation and provision of care management services.    Casimer Lanius, [place] / [place]   [phone] 4:14 PM

## 2020-07-09 DIAGNOSIS — M545 Low back pain, unspecified: Secondary | ICD-10-CM | POA: Diagnosis not present

## 2020-07-09 DIAGNOSIS — M4646 Discitis, unspecified, lumbar region: Secondary | ICD-10-CM | POA: Diagnosis not present

## 2020-07-09 DIAGNOSIS — G43909 Migraine, unspecified, not intractable, without status migrainosus: Secondary | ICD-10-CM | POA: Diagnosis not present

## 2020-07-09 DIAGNOSIS — J45998 Other asthma: Secondary | ICD-10-CM | POA: Diagnosis not present

## 2020-07-19 IMAGING — CT CT PELVIS W/O CM
2 of 3 series · 16 of 46 positions shown, 18 images · non-contrast
Comparison: None.

CLINICAL DATA: Left-sided pain after fall.

EXAM:
CT PELVIS WITHOUT CONTRAST
TECHNIQUE: Multidetector CT imaging of the pelvis was performed following the
standard protocol without intravenous contrast.

[Series 3: pelvis 2.0 st · axial · 0.93mm/px · z∈[+660,+886]mm · 13 of 131 slices shown, 15 images]
[im 9/131  soft-tissue]
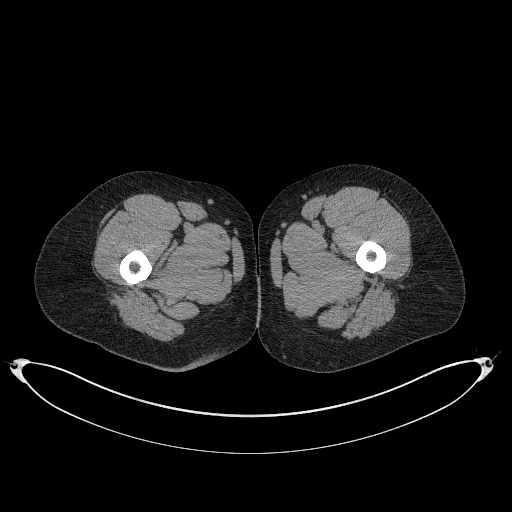
[im 9/131  bone]
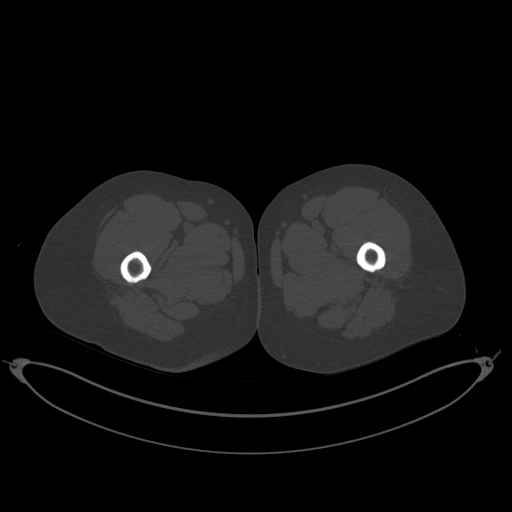
[im 17/131  soft-tissue]
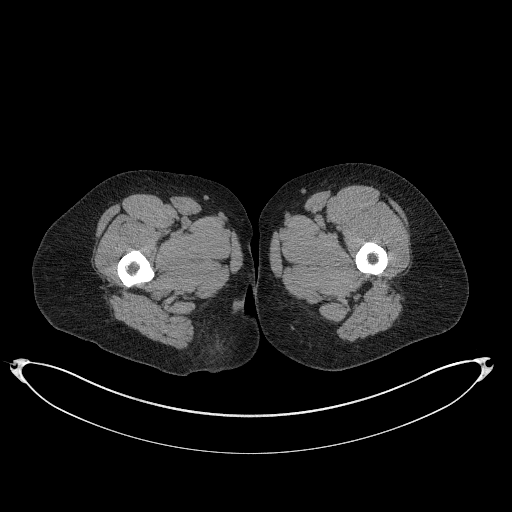
[im 26/131  soft-tissue]
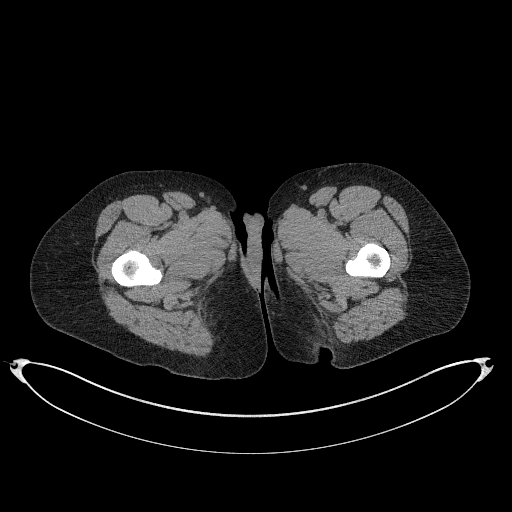
[im 38/131  soft-tissue]
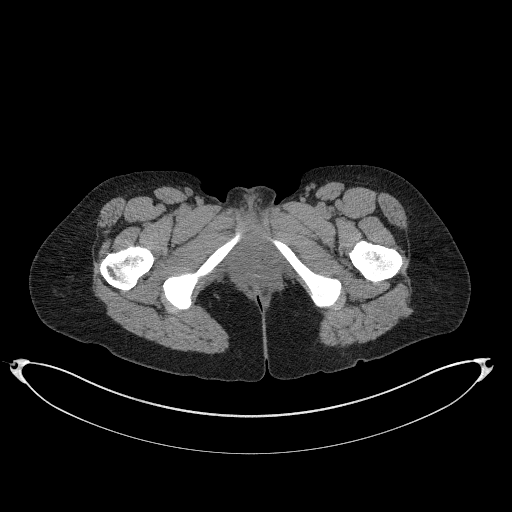
[im 47/131  soft-tissue]
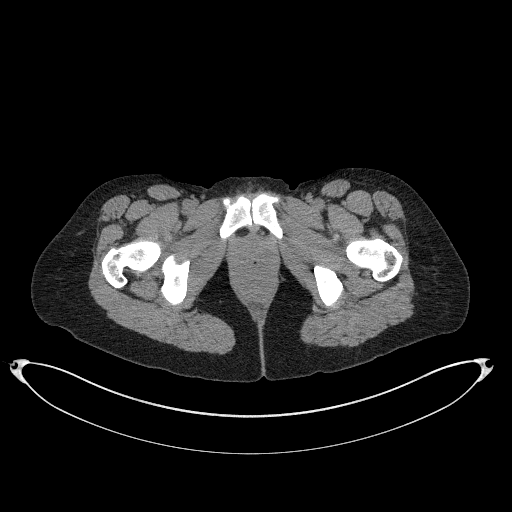
[im 55/131  soft-tissue]
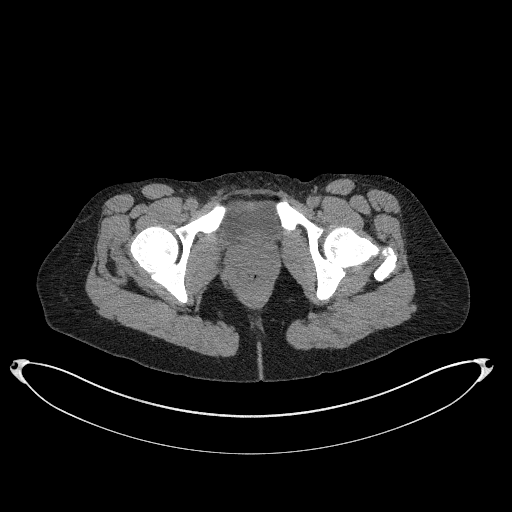
[im 68/131  soft-tissue]
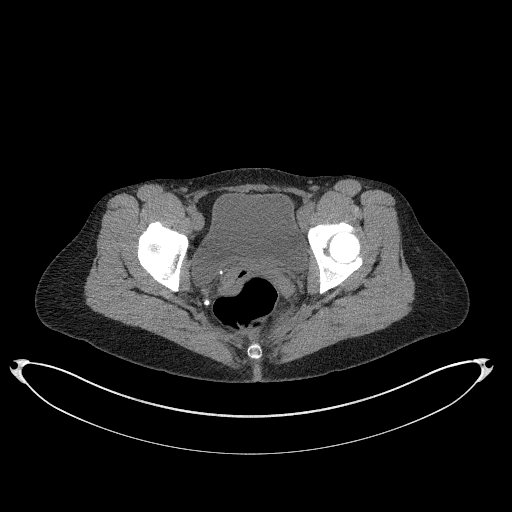
[im 76/131  soft-tissue]
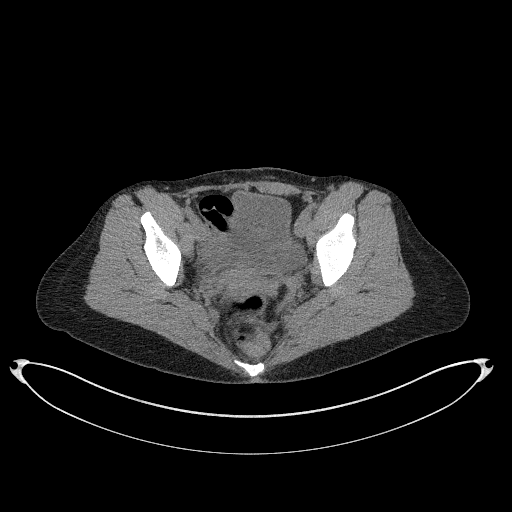
[im 84/131  soft-tissue]
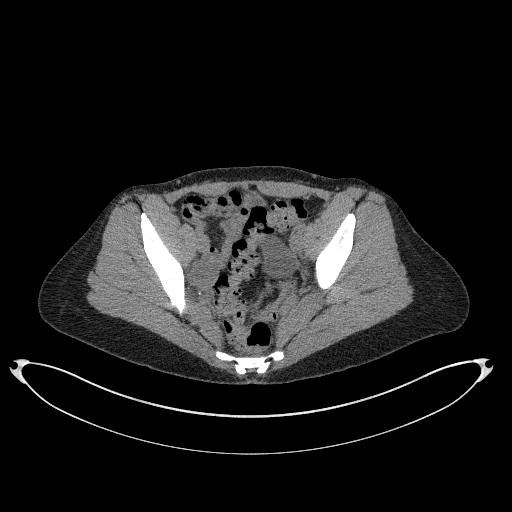
[im 84/131  bone]
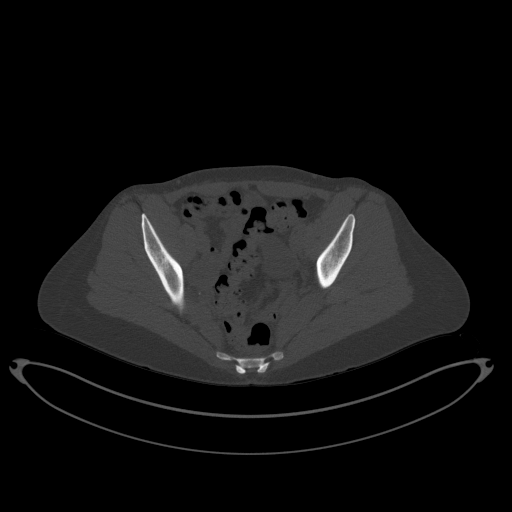
[im 93/131  soft-tissue]
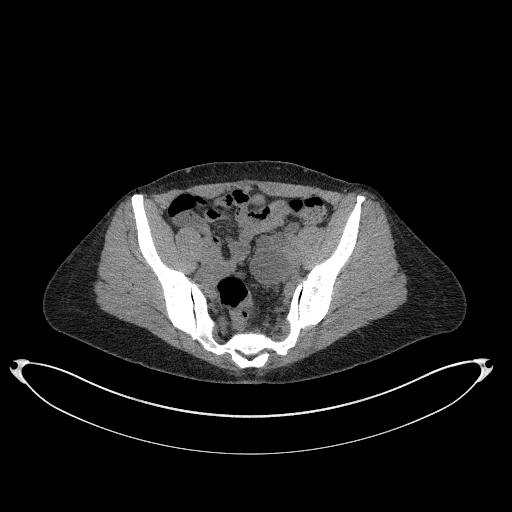
[im 105/131  soft-tissue]
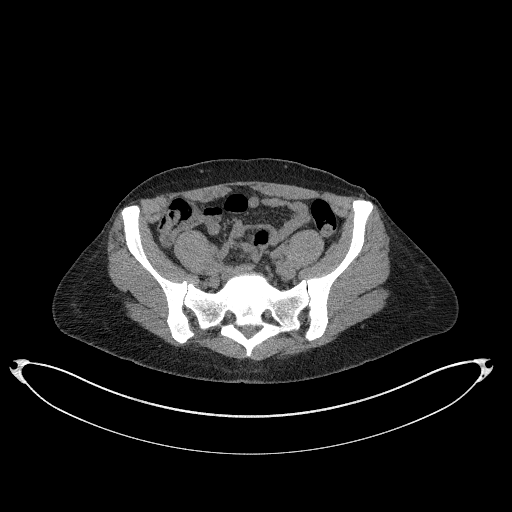
[im 114/131  soft-tissue]
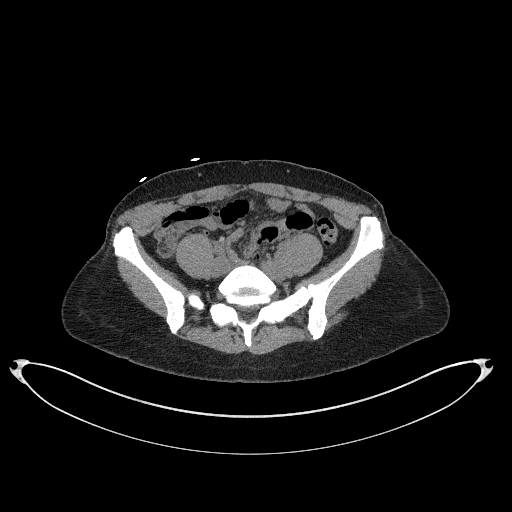
[im 122/131  soft-tissue]
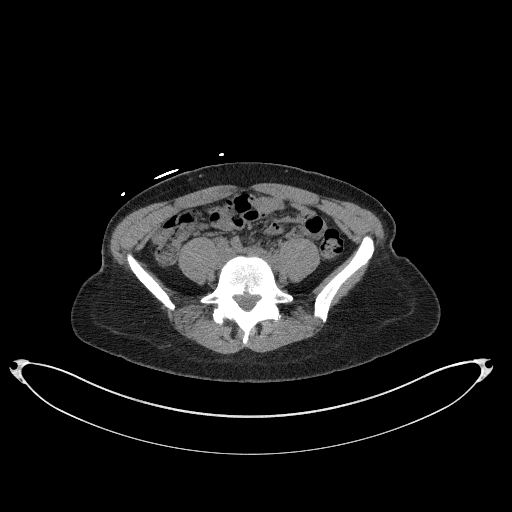

[Series 8: coronal st · coronal · 0.51mm/px · 3 of 118 slices shown]
[im 40/118  soft-tissue]
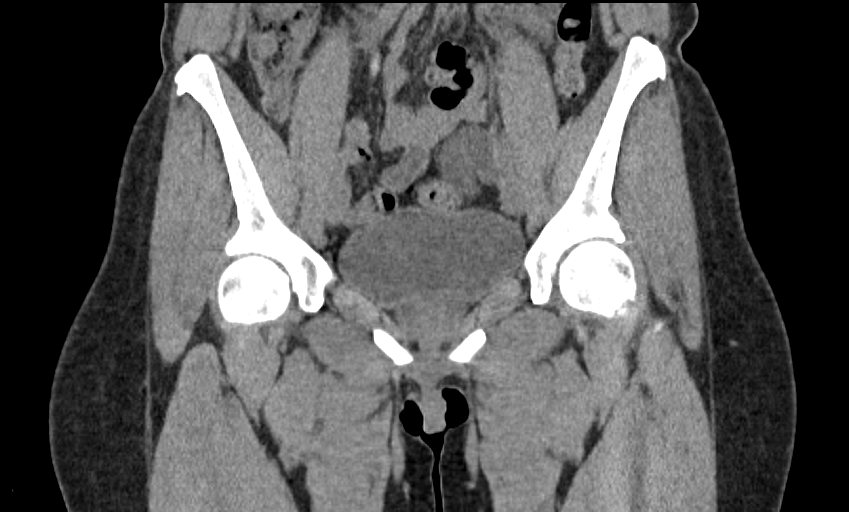
[im 53/118  soft-tissue]
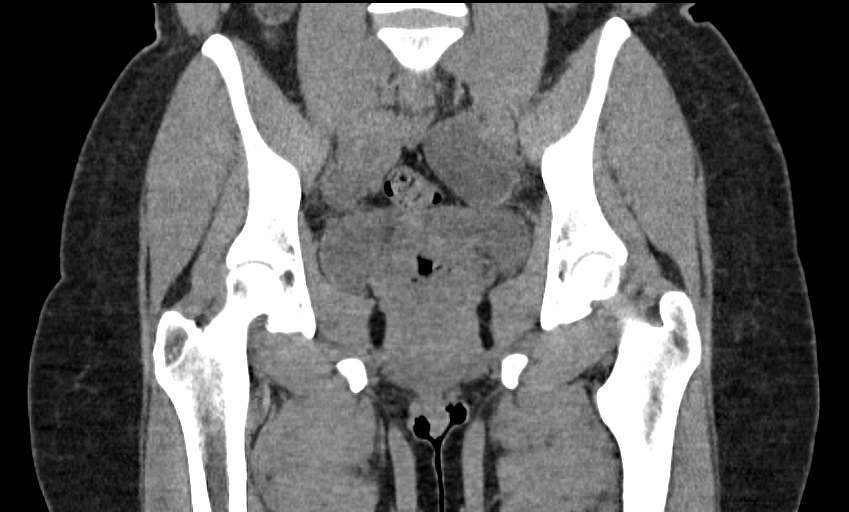
[im 66/118  soft-tissue]
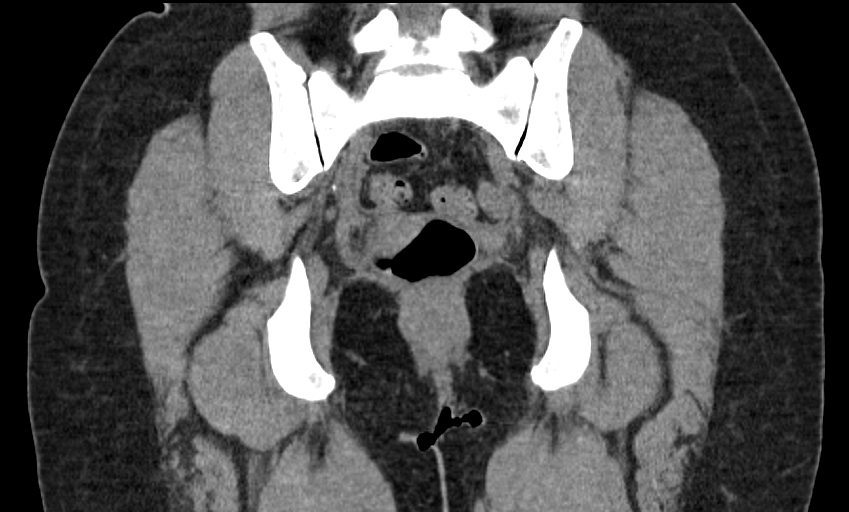

[16 of 46 positions shown; findings below may reference images not displayed]

FINDINGS: Urinary Tract:  No abnormality visualized.

Bowel:  Unremarkable visualized pelvic bowel loops.

Vascular/Lymphatic: No pathologically enlarged lymph nodes. No
significant vascular abnormality seen.

Reproductive: Patient is status post hysterectomy. The right ovary
is normal. There is a dominant probably septated cyst in the left
adnexa, probably the ovary, measuring 5.7 x 4.6 cm.

Other:  None.

Musculoskeletal: Facet degenerative changes in the lumbosacral
spine. No fractures.
IMPRESSION: 1. No fractures identified.
2. There is a 5.7 x 4.6 cm complex cystic mass measuring up to
cm, in the left adnexa. Recommend ultrasound for better evaluation.
The ultrasound could be performed as an outpatient as it is
unrelated to the patient's current visit.

## 2020-07-23 ENCOUNTER — Telehealth: Payer: Self-pay | Admitting: Licensed Clinical Social Worker

## 2020-07-23 NOTE — Chronic Care Management (AMB) (Signed)
    Clinical Social Work  Care Management  2nd Unsuccessful Phone Outreach    07/23/2020 Name: Chelsea Morse MRN: [id number] DOB: [birth year]  Chelsea Morse is a 50 y.o. year old female who is a primary care patient of Carollee Leitz, MD .   2nd unsuccessful F/U phone call to Chelsea Morse today to assess needs, and progress with care plan goals.  Unable to leave a HIPPA compliant phone message due to voice mail not set up or no answer.   Plan: LCSW will reach out again over the next 30 days Review of patient status, including review of consultants reports, relevant laboratory and other test results, and collaboration with appropriate care team members and the patient's provider was performed as part of comprehensive patient evaluation and provision of care management services.    Casimer Lanius, [place] / [place]   [phone] 3:14 PM

## 2020-08-26 ENCOUNTER — Telehealth: Payer: Self-pay | Admitting: Licensed Clinical Social Worker

## 2020-08-26 NOTE — Chronic Care Management (AMB) (Signed)
    Clinical Social Work  Care Management  Unsuccessful Phone Outreach    08/26/2020 Name: Chelsea Morse MRN: [id number] DOB: 11-22-1970  Chelsea Morse is a 50 y.o. year old female who is a primary care patient of Carollee Leitz, MD .   Third unsuccessful telephone outreach was attempted today.  The patient was referred to the case management team for assistance with care management and care coordination. The patient's primary care provider has been notified of our unsuccessful attempts to make or maintain contact with the patient.  Unable to leave a HIPPA compliant phone message due to voice mail not set up.  .   Plan:LCSW will discontinue outreach.The care management team is pleased to engage with this patient at any time in the future should he/she be interested in assistance from the care management team.  LCSW will disconnect form care team if unable to connect with patient in the next 30 days.  Review of patient status, including review of consultants reports, relevant laboratory and other test results, and collaboration with appropriate care team members and the patient's provider was performed as part of comprehensive patient evaluation and provision of care management services.    Casimer Lanius, [place] / [place]   [phone] 4:32 PM

## 2020-09-29 IMAGING — CT CT RENAL STONE PROTOCOL
2 of 4 series · 15 of 46 positions shown, 17 images · non-contrast
Comparison: 06/01/2018

CLINICAL DATA: Pt states that she is having LUQ abd pain x 2 day.
Pt states that 3 nights ago she had 1 glass of wine. Pt woke up with
pain in the LUQ, pt states that the pain felt like she needed to
eat. Pt states that the pain has continued to get worse. Pt denies
vomiting but states that she has had nausea and dry heaving and some
diarrhea. Pt is in NAD at this time. Hysterectomy.

EXAM:
CT ABDOMEN AND PELVIS WITHOUT CONTRAST
TECHNIQUE: Multidetector CT imaging of the abdomen and pelvis was performed
following the standard protocol without IV contrast.

[Series 2: stone full standard · axial · 0.62mm/px · z∈[-954,-544]mm · 12 of 90 slices shown, 14 images]
[im 4/90  soft-tissue]
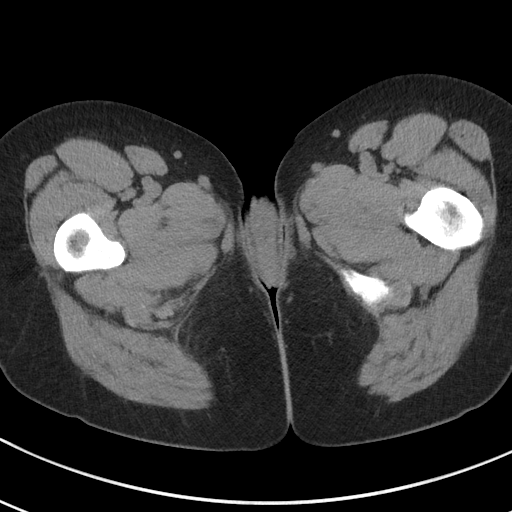
[im 4/90  bone]
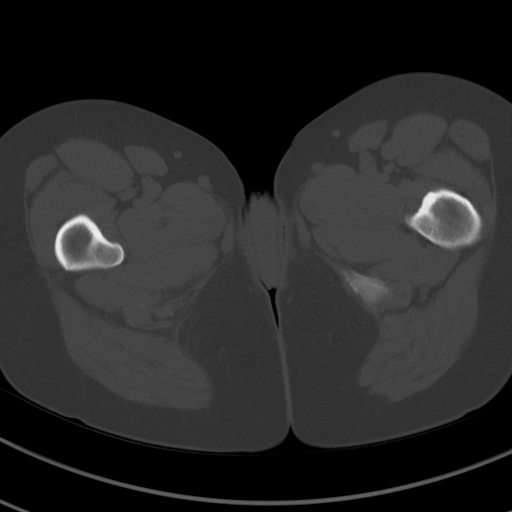
[im 12/90  soft-tissue]
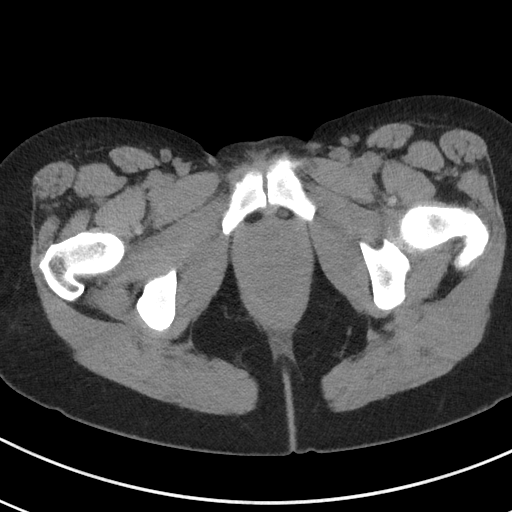
[im 19/90  soft-tissue]
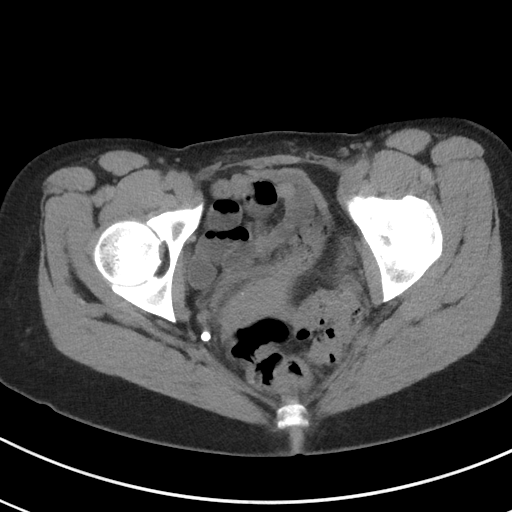
[im 26/90  soft-tissue]
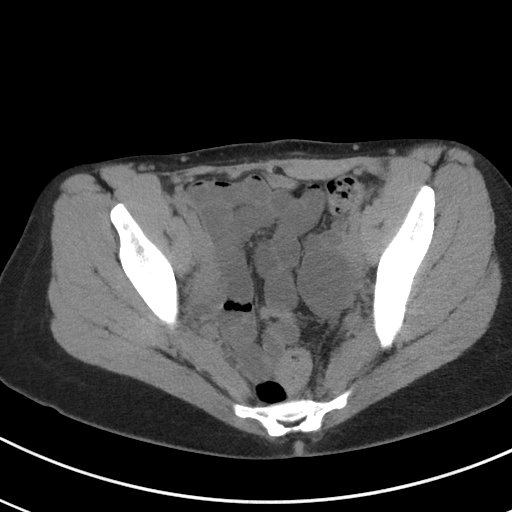
[im 34/90  soft-tissue]
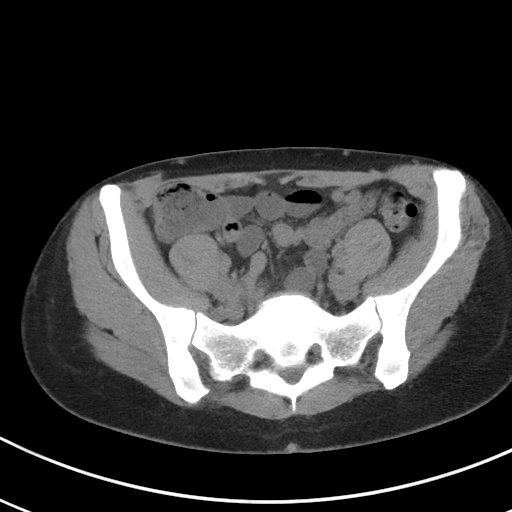
[im 41/90  soft-tissue]
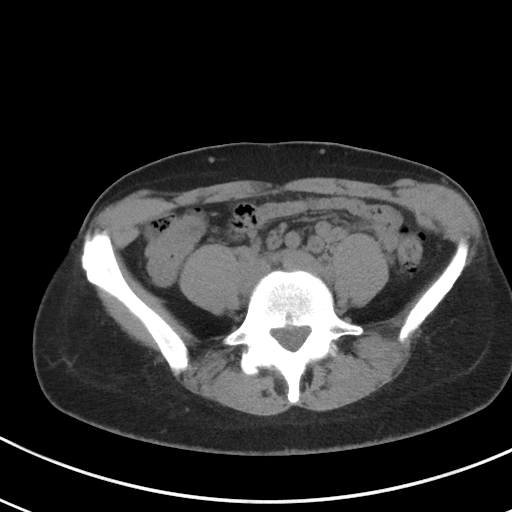
[im 49/90  soft-tissue]
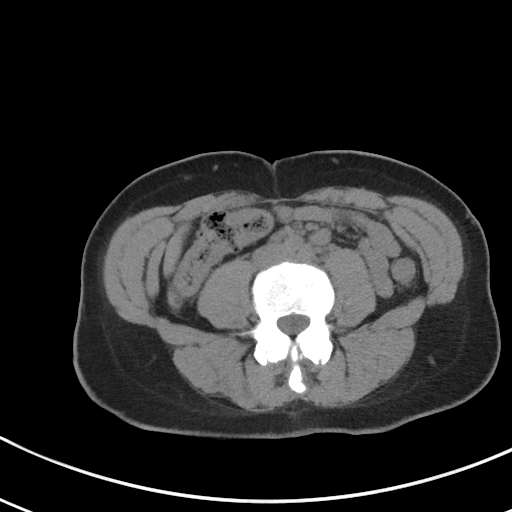
[im 56/90  soft-tissue]
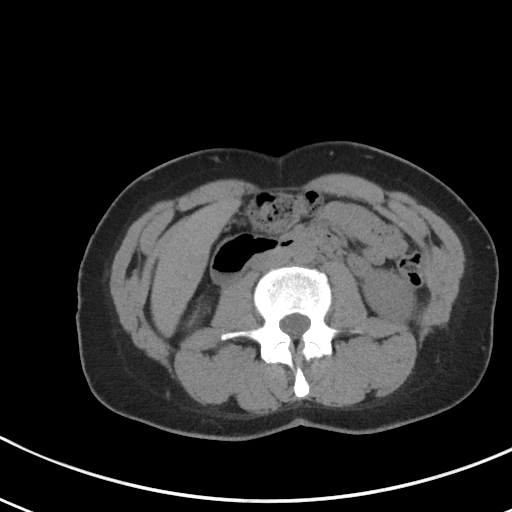
[im 64/90  soft-tissue]
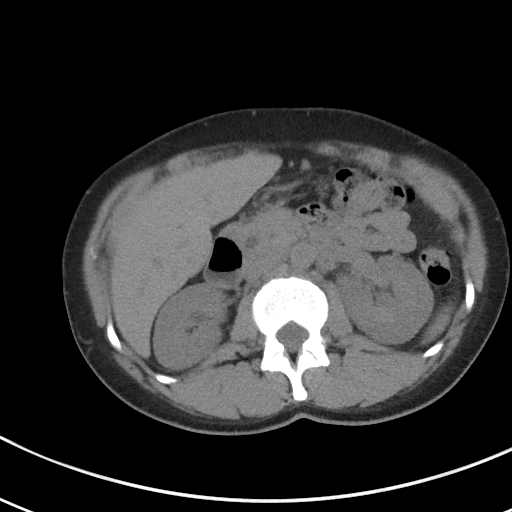
[im 64/90  bone]
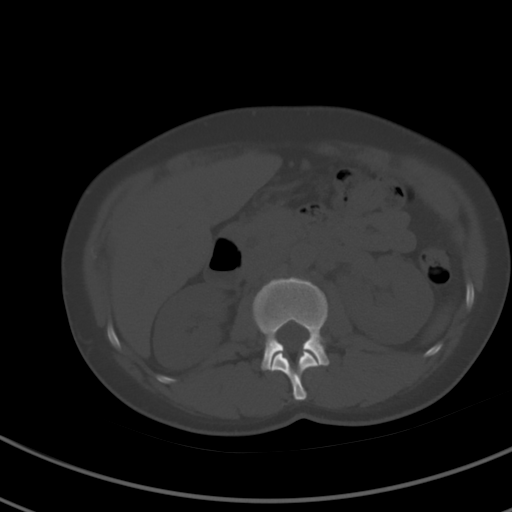
[im 71/90  soft-tissue]
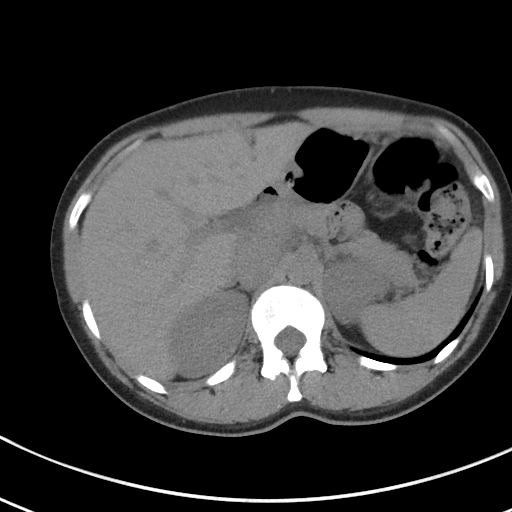
[im 78/90  soft-tissue]
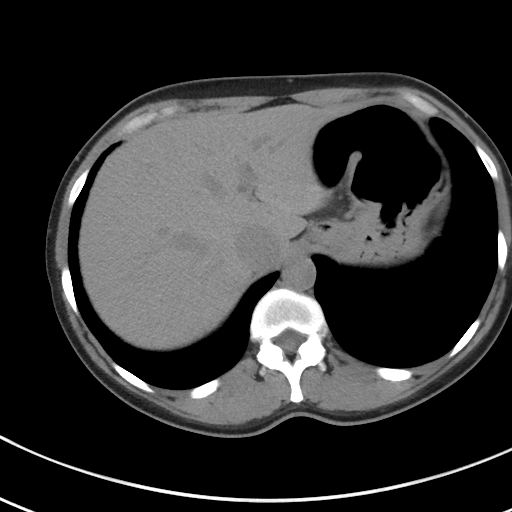
[im 86/90  soft-tissue]
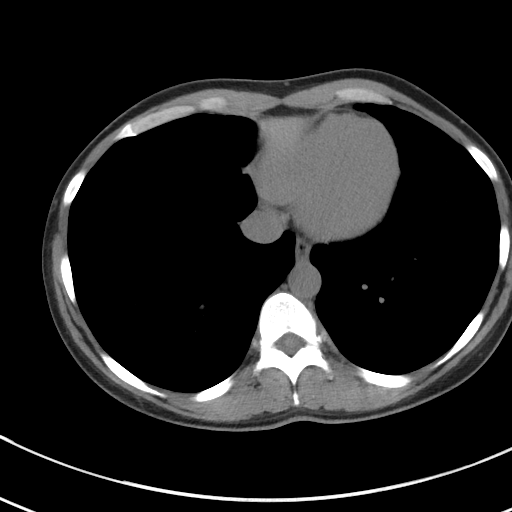

[Series 5: coronal · coronal · 0.61mm/px · 3 of 115 slices shown]
[im 39/115  soft-tissue]
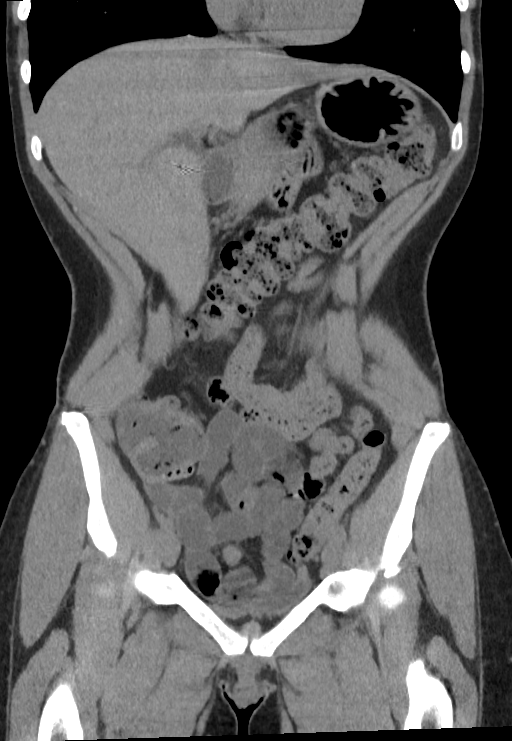
[im 51/115  soft-tissue]
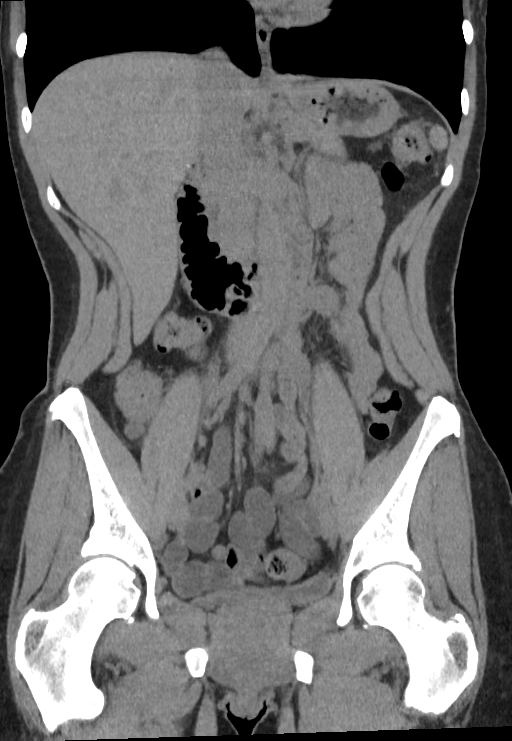
[im 64/115  soft-tissue]
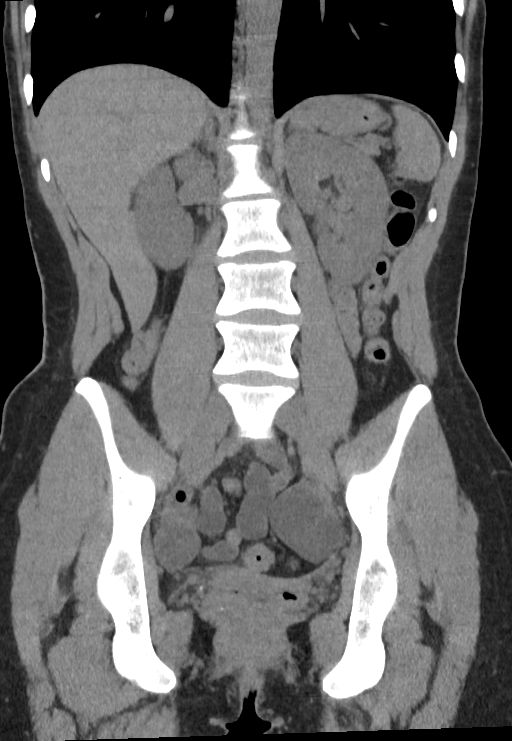

[15 of 46 positions shown; findings below may reference images not displayed]

FINDINGS: Lower chest: Clear lung bases.  Heart normal size.

Hepatobiliary: No focal liver abnormality is seen. Status post
cholecystectomy. No biliary dilatation.

Pancreas: Unremarkable. No pancreatic ductal dilatation or
surrounding inflammatory changes.

Spleen: Normal in size without focal abnormality.

Adrenals/Urinary Tract: Adrenal glands are unremarkable. Kidneys are
normal, without renal calculi, focal lesion, or hydronephrosis.
Bladder is unremarkable.

Stomach/Bowel: Stomach is within normal limits. Appendix appears
normal. No evidence of bowel wall thickening, distention, or
inflammatory changes.

Vascular/Lymphatic: No significant vascular findings are present. No
enlarged abdominal or pelvic lymph nodes.

Reproductive: Uterus surgically absent. Left ovary mildly enlarged
by cysts, measuring 5.1 x 4.1 x 5.1 cm. Right ovary normal in size
measuring 3.7 x 2.8 x 3.9 cm. These findings are similar to the
prior CT.

Other: No abdominal wall hernia.  No ascites.

Musculoskeletal: No fracture or acute finding. Are facet
degenerative changes of the lumbar spine. No osteoblastic or
osteolytic lesions.
IMPRESSION: 1. No acute findings within the abdomen or pelvis. No findings to
account for the patient's left upper quadrant pain. No renal or
ureteral stones or obstructive uropathy.
2. Persistent enlargement of the left ovary by apparent cysts,
without change from prior CT. Recommend further assessment with
nonemergent pelvic ultrasound as recommended on the prior CT. Status
post hysterectomy.
3. Status post cholecystectomy.

## 2021-12-15 ENCOUNTER — Encounter: Payer: Self-pay | Admitting: *Deleted

## 2023-09-27 ENCOUNTER — Other Ambulatory Visit: Payer: Self-pay

## 2023-09-27 ENCOUNTER — Ambulatory Visit (HOSPITAL_COMMUNITY)
Admission: EM | Admit: 2023-09-27 | Discharge: 2023-09-27 | Disposition: A | Discharge status: Home / Self Care | Attending: Psychiatry | Admitting: Psychiatry

## 2023-09-27 ENCOUNTER — Encounter (HOSPITAL_COMMUNITY): Payer: Self-pay | Admitting: Psychiatry

## 2023-09-27 ENCOUNTER — Inpatient Hospital Stay (HOSPITAL_COMMUNITY)
Admission: AD | Admit: 2023-09-27 | Discharge: 2023-09-30 | DRG: 885 | Disposition: A | Discharge status: Home / Self Care | Source: Intra-hospital | Attending: Psychiatry | Admitting: Psychiatry

## 2023-09-27 DIAGNOSIS — G8929 Other chronic pain: Secondary | ICD-10-CM | POA: Diagnosis present

## 2023-09-27 DIAGNOSIS — R001 Bradycardia, unspecified: Secondary | ICD-10-CM | POA: Insufficient documentation

## 2023-09-27 DIAGNOSIS — F4310 Post-traumatic stress disorder, unspecified: Secondary | ICD-10-CM | POA: Diagnosis present

## 2023-09-27 DIAGNOSIS — Z630 Problems in relationship with spouse or partner: Secondary | ICD-10-CM | POA: Diagnosis not present

## 2023-09-27 DIAGNOSIS — F332 Major depressive disorder, recurrent severe without psychotic features: Principal | ICD-10-CM | POA: Diagnosis present

## 2023-09-27 DIAGNOSIS — M5030 Other cervical disc degeneration, unspecified cervical region: Secondary | ICD-10-CM | POA: Diagnosis present

## 2023-09-27 DIAGNOSIS — F17210 Nicotine dependence, cigarettes, uncomplicated: Secondary | ICD-10-CM | POA: Diagnosis present

## 2023-09-27 DIAGNOSIS — Z62810 Personal history of physical and sexual abuse in childhood: Secondary | ICD-10-CM | POA: Insufficient documentation

## 2023-09-27 DIAGNOSIS — Z5986 Financial insecurity: Secondary | ICD-10-CM

## 2023-09-27 DIAGNOSIS — Z91411 Personal history of adult psychological abuse: Secondary | ICD-10-CM

## 2023-09-27 DIAGNOSIS — Z6981 Encounter for mental health services for victim of other abuse: Secondary | ICD-10-CM | POA: Diagnosis present

## 2023-09-27 DIAGNOSIS — M797 Fibromyalgia: Secondary | ICD-10-CM | POA: Diagnosis present

## 2023-09-27 DIAGNOSIS — F6381 Intermittent explosive disorder: Secondary | ICD-10-CM | POA: Diagnosis present

## 2023-09-27 DIAGNOSIS — G4700 Insomnia, unspecified: Secondary | ICD-10-CM | POA: Diagnosis present

## 2023-09-27 DIAGNOSIS — J45909 Unspecified asthma, uncomplicated: Secondary | ICD-10-CM | POA: Diagnosis present

## 2023-09-27 DIAGNOSIS — E559 Vitamin D deficiency, unspecified: Secondary | ICD-10-CM | POA: Diagnosis present

## 2023-09-27 DIAGNOSIS — E890 Postprocedural hypothyroidism: Secondary | ICD-10-CM | POA: Diagnosis present

## 2023-09-27 DIAGNOSIS — R45851 Suicidal ideations: Secondary | ICD-10-CM | POA: Diagnosis present

## 2023-09-27 DIAGNOSIS — X789XXA Intentional self-harm by unspecified sharp object, initial encounter: Secondary | ICD-10-CM | POA: Diagnosis not present

## 2023-09-27 DIAGNOSIS — F17200 Nicotine dependence, unspecified, uncomplicated: Secondary | ICD-10-CM | POA: Diagnosis present

## 2023-09-27 DIAGNOSIS — F322 Major depressive disorder, single episode, severe without psychotic features: Secondary | ICD-10-CM | POA: Diagnosis not present

## 2023-09-27 DIAGNOSIS — Z9152 Personal history of nonsuicidal self-harm: Secondary | ICD-10-CM | POA: Diagnosis not present

## 2023-09-27 DIAGNOSIS — F419 Anxiety disorder, unspecified: Secondary | ICD-10-CM | POA: Insufficient documentation

## 2023-09-27 DIAGNOSIS — Z90711 Acquired absence of uterus with remaining cervical stump: Secondary | ICD-10-CM

## 2023-09-27 DIAGNOSIS — Z886 Allergy status to analgesic agent status: Secondary | ICD-10-CM | POA: Diagnosis not present

## 2023-09-27 DIAGNOSIS — D573 Sickle-cell trait: Secondary | ICD-10-CM | POA: Diagnosis present

## 2023-09-27 DIAGNOSIS — Z833 Family history of diabetes mellitus: Secondary | ICD-10-CM

## 2023-09-27 DIAGNOSIS — Z7951 Long term (current) use of inhaled steroids: Secondary | ICD-10-CM

## 2023-09-27 DIAGNOSIS — I10 Essential (primary) hypertension: Secondary | ICD-10-CM | POA: Diagnosis present

## 2023-09-27 DIAGNOSIS — Z5941 Food insecurity: Secondary | ICD-10-CM

## 2023-09-27 DIAGNOSIS — Z5982 Transportation insecurity: Secondary | ICD-10-CM | POA: Diagnosis not present

## 2023-09-27 DIAGNOSIS — T7491XA Unspecified adult maltreatment, confirmed, initial encounter: Secondary | ICD-10-CM | POA: Diagnosis present

## 2023-09-27 DIAGNOSIS — Z91410 Personal history of adult physical and sexual abuse: Secondary | ICD-10-CM

## 2023-09-27 DIAGNOSIS — Z8249 Family history of ischemic heart disease and other diseases of the circulatory system: Secondary | ICD-10-CM

## 2023-09-27 DIAGNOSIS — Z79899 Other long term (current) drug therapy: Secondary | ICD-10-CM

## 2023-09-27 DIAGNOSIS — F603 Borderline personality disorder: Secondary | ICD-10-CM | POA: Diagnosis present

## 2023-09-27 DIAGNOSIS — Z832 Family history of diseases of the blood and blood-forming organs and certain disorders involving the immune mechanism: Secondary | ICD-10-CM

## 2023-09-27 DIAGNOSIS — Z818 Family history of other mental and behavioral disorders: Secondary | ICD-10-CM

## 2023-09-27 DIAGNOSIS — Z9049 Acquired absence of other specified parts of digestive tract: Secondary | ICD-10-CM

## 2023-09-27 LAB — LIPID PANEL
Cholesterol: 140 mg/dL (ref 0–200)
HDL: 40 mg/dL — ABNORMAL LOW (ref >40)
LDL Cholesterol: 90 mg/dL (ref 0–99)
Total CHOL/HDL Ratio: 3.5 ratio
Triglycerides: 49 mg/dL (ref <150)
VLDL: 10 mg/dL (ref 0–40)

## 2023-09-27 LAB — COMPREHENSIVE METABOLIC PANEL
ALT: 23 U/L (ref 0–44)
AST: 32 U/L (ref 15–41)
Albumin: 4.9 g/dL (ref 3.5–5.0)
Alkaline Phosphatase: 78 U/L (ref 38–126)
Anion gap: 8 (ref 5–15)
BUN: 13 mg/dL (ref 6–20)
CO2: 24 mmol/L (ref 22–32)
Calcium: 9.6 mg/dL (ref 8.9–10.3)
Chloride: 106 mmol/L (ref 98–111)
Creatinine, Ser: 0.79 mg/dL (ref 0.44–1.00)
GFR, Estimated: >60 mL/min (ref >60)
Glucose, Bld: 87 mg/dL (ref 70–99)
Potassium: 4.4 mmol/L (ref 3.5–5.1)
Sodium: 138 mmol/L (ref 135–145)
Total Bilirubin: 1 mg/dL (ref 0.0–1.2)
Total Protein: 8.1 g/dL (ref 6.5–8.1)

## 2023-09-27 LAB — POCT URINE DRUG SCREEN - MANUAL ENTRY (I-SCREEN)
POC Amphetamine UR: NOT DETECTED
POC Buprenorphine (BUP): NOT DETECTED
POC Cocaine UR: NOT DETECTED
POC Marijuana UR: POSITIVE — AB
POC Methadone UR: NOT DETECTED
POC Methamphetamine UR: NOT DETECTED
POC Morphine: NOT DETECTED
POC Oxazepam (BZO): NOT DETECTED
POC Oxycodone UR: NOT DETECTED
POC Secobarbital (BAR): NOT DETECTED

## 2023-09-27 LAB — CBC WITH DIFFERENTIAL/PLATELET
Abs Immature Granulocytes: 0.04 10*3/uL (ref 0.00–0.07)
Basophils Absolute: 0 10*3/uL (ref 0.0–0.1)
Basophils Relative: 0 %
Eosinophils Absolute: 0.1 10*3/uL (ref 0.0–0.5)
Eosinophils Relative: 1 %
HCT: 41.7 % (ref 36.0–46.0)
Hemoglobin: 14.6 g/dL (ref 12.0–15.0)
Immature Granulocytes: 0 %
Lymphocytes Relative: 27 %
Lymphs Abs: 2.9 10*3/uL (ref 0.7–4.0)
MCH: 26.9 pg (ref 26.0–34.0)
MCHC: 35 g/dL (ref 30.0–36.0)
MCV: 76.8 fL — ABNORMAL LOW (ref 80.0–100.0)
Monocytes Absolute: 0.7 10*3/uL (ref 0.1–1.0)
Monocytes Relative: 6 %
Neutro Abs: 7 10*3/uL (ref 1.7–7.7)
Neutrophils Relative %: 66 %
Platelets: 310 10*3/uL (ref 150–400)
RBC: 5.43 MIL/uL — ABNORMAL HIGH (ref 3.87–5.11)
RDW: 13.3 % (ref 11.5–15.5)
WBC: 10.7 10*3/uL — ABNORMAL HIGH (ref 4.0–10.5)
nRBC: 0 % (ref 0.0–0.2)

## 2023-09-27 LAB — URINALYSIS, COMPLETE (UACMP) WITH MICROSCOPIC
Bilirubin Urine: NEGATIVE
Glucose, UA: NEGATIVE mg/dL
Hgb urine dipstick: NEGATIVE
Ketones, ur: 80 mg/dL — AB
Nitrite: POSITIVE — AB
Protein, ur: NEGATIVE mg/dL
Specific Gravity, Urine: 1.021 (ref 1.005–1.030)
pH: 6 (ref 5.0–8.0)

## 2023-09-27 LAB — POC URINE PREG, ED: Preg Test, Ur: NEGATIVE

## 2023-09-27 LAB — POCT PREGNANCY, URINE: Preg Test, Ur: NEGATIVE

## 2023-09-27 LAB — HEMOGLOBIN A1C
Hgb A1c MFr Bld: 5.1 % (ref 4.8–5.6)
Mean Plasma Glucose: 99.67 mg/dL

## 2023-09-27 LAB — MAGNESIUM: Magnesium: 2 mg/dL (ref 1.7–2.4)

## 2023-09-27 LAB — TSH: TSH: 1.489 u[IU]/mL (ref 0.350–4.500)

## 2023-09-27 LAB — ETHANOL: Alcohol, Ethyl (B): <10 mg/dL (ref <10)

## 2023-09-27 MED ORDER — NITROFURANTOIN MONOHYD MACRO 100 MG PO CAPS: 100.0000 mg | ORAL_CAPSULE | Freq: Two times a day (BID) | ORAL | Status: DC

## 2023-09-27 MED ORDER — NITROFURANTOIN MONOHYD MACRO 100 MG PO CAPS
100.0000 mg | ORAL_CAPSULE | Freq: Two times a day (BID) | ORAL | Status: DC
  Administered 2023-09-27 – 2023-09-30 (×6): 100 mg via ORAL
  Filled 2023-09-27 (×8): qty 1

## 2023-09-27 MED ORDER — TRAZODONE HCL 50 MG PO TABS
50.0000 mg | ORAL_TABLET | Freq: Every evening | ORAL | Status: DC | PRN
  Administered 2023-09-28: 50 mg via ORAL
  Filled 2023-09-27 (×2): qty 1

## 2023-09-27 MED ORDER — NITROFURANTOIN MONOHYD MACRO 100 MG PO CAPS
100.0000 mg | ORAL_CAPSULE | Freq: Two times a day (BID) | ORAL | Status: DC
  Administered 2023-09-27: 100 mg via ORAL
  Filled 2023-09-27: qty 1

## 2023-09-27 MED ORDER — HALOPERIDOL 5 MG PO TABS: 5.0000 mg | ORAL_TABLET | Freq: Three times a day (TID) | ORAL | Status: DC | PRN

## 2023-09-27 MED ORDER — MAGNESIUM HYDROXIDE 400 MG/5ML PO SUSP: 30.0000 mL | Freq: Every day | ORAL | Status: DC | PRN

## 2023-09-27 MED ORDER — HALOPERIDOL LACTATE 5 MG/ML IJ SOLN: 5.0000 mg | Freq: Three times a day (TID) | INTRAMUSCULAR | Status: DC | PRN

## 2023-09-27 MED ORDER — NAPROXEN 375 MG PO TABS: 375.0000 mg | ORAL_TABLET | Freq: Two times a day (BID) | ORAL | Status: DC | PRN

## 2023-09-27 MED ORDER — HALOPERIDOL LACTATE 5 MG/ML IJ SOLN: 10.0000 mg | Freq: Three times a day (TID) | INTRAMUSCULAR | Status: DC | PRN

## 2023-09-27 MED ORDER — DIPHENHYDRAMINE HCL 50 MG/ML IJ SOLN: 50.0000 mg | Freq: Three times a day (TID) | INTRAMUSCULAR | Status: DC | PRN

## 2023-09-27 MED ORDER — FLUTICASONE PROPIONATE 50 MCG/ACT NA SUSP
1.0000 | Freq: Every day | NASAL | Status: DC
  Administered 2023-09-28 – 2023-09-30 (×3): 1 via NASAL
  Filled 2023-09-27 (×2): qty 16

## 2023-09-27 MED ORDER — LORAZEPAM 2 MG/ML IJ SOLN: 2.0000 mg | Freq: Three times a day (TID) | INTRAMUSCULAR | Status: DC | PRN

## 2023-09-27 MED ORDER — ALUM & MAG HYDROXIDE-SIMETH 200-200-20 MG/5ML PO SUSP: 30.0000 mL | ORAL | Status: DC | PRN

## 2023-09-27 MED ORDER — ACETAMINOPHEN 325 MG PO TABS
650.0000 mg | ORAL_TABLET | Freq: Four times a day (QID) | ORAL | Status: DC | PRN
  Administered 2023-09-27 – 2023-09-28 (×3): 650 mg via ORAL
  Filled 2023-09-27 (×3): qty 2

## 2023-09-27 MED ORDER — DIPHENHYDRAMINE HCL 25 MG PO CAPS: 50.0000 mg | ORAL_CAPSULE | Freq: Three times a day (TID) | ORAL | Status: DC | PRN

## 2023-09-27 MED ORDER — ENSURE ENLIVE PO LIQD
237.0000 mL | Freq: Two times a day (BID) | ORAL | Status: DC
  Administered 2023-09-27: 237 mL via ORAL
  Filled 2023-09-27 (×8): qty 237

## 2023-09-27 MED ORDER — ACETAMINOPHEN 325 MG PO TABS: 650.0000 mg | ORAL_TABLET | Freq: Four times a day (QID) | ORAL | Status: DC | PRN

## 2023-09-27 MED ORDER — HYDROXYZINE HCL 25 MG PO TABS
25.0000 mg | ORAL_TABLET | Freq: Three times a day (TID) | ORAL | Status: DC | PRN
  Administered 2023-09-27 – 2023-09-29 (×3): 25 mg via ORAL
  Filled 2023-09-27 (×3): qty 1

## 2023-09-27 MED ORDER — HALOPERIDOL 5 MG PO TABS
5.0000 mg | ORAL_TABLET | Freq: Three times a day (TID) | ORAL | Status: DC | PRN
Start: 2023-09-27 — End: 2023-09-30

## 2023-09-27 MED ORDER — ALBUTEROL SULFATE (2.5 MG/3ML) 0.083% IN NEBU
2.5000 mg | INHALATION_SOLUTION | Freq: Four times a day (QID) | RESPIRATORY_TRACT | Status: DC | PRN
Start: 2023-09-27 — End: 2023-09-30

## 2023-09-27 MED ORDER — TRAZODONE HCL 50 MG PO TABS: 50.0000 mg | ORAL_TABLET | Freq: Every evening | ORAL | Status: DC | PRN

## 2023-09-27 MED ORDER — HYDROXYZINE HCL 25 MG PO TABS: 25.0000 mg | ORAL_TABLET | Freq: Three times a day (TID) | ORAL | Status: DC | PRN

## 2023-09-27 MED ORDER — DIPHENHYDRAMINE HCL 50 MG PO CAPS: 50.0000 mg | ORAL_CAPSULE | Freq: Three times a day (TID) | ORAL | Status: DC | PRN

## 2023-09-27 MED ORDER — ALBUTEROL SULFATE HFA 108 (90 BASE) MCG/ACT IN AERS
2.0000 | INHALATION_SPRAY | RESPIRATORY_TRACT | Status: DC | PRN
  Administered 2023-09-27 – 2023-09-28 (×2): 2 via RESPIRATORY_TRACT
  Filled 2023-09-27: qty 6.7

## 2023-09-27 NOTE — Plan of Care (Signed)

## 2023-09-27 NOTE — Progress Notes (Signed)
 Pt is admitted to Continuous observation due to SI with no current plan. Pt verbally contracts for safety on the unit. Pt advised to notify staff when having intrusive thoughts of hurting self or others. Pt verbalized understanding. Pt is alert and oriented X4 with flat and irritable affect. Pt is ambulatory and is oriented to staff/unit. Pt was cooperative with lab and skin assessment. Superficial lacerations were noted on pt's neck, left wrist, left forearm, left and right side of face. Pt reported cutting self on Sunday. No redness or drainage noted. Pt complained of generalized pain. Pt denies HI/AVH. Staff will monitor for pt's safety.

## 2023-09-27 NOTE — Group Note (Signed)
 Date:  09/27/2023 Time:  8:59 PM  Group Topic/Focus:  Wrap-Up Group:   The focus of this group is to help patients review their daily goal of treatment and discuss progress on daily workbooks.    Participation Level:  Did Not Attend  Participation Quality:   n/a  Affect:   n/a  Cognitive:   n/a  Insight: None  Engagement in Group:   n/a  Modes of Intervention:   n/a  Additional Comments:  Patient did not attend wrap up group.   Kennieth Francois 09/27/2023, 8:59 PM

## 2023-09-27 NOTE — Discharge Instructions (Addendum)
 Transfer to [facility] For IP admission, Dr. Abbott Pao is the accepting md
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
## 2023-09-27 NOTE — Group Note (Signed)
 LCSW Group Therapy Note   Group Date: 09/27/2023 Start Time: 1100 End Time: 1200   Participation:  patient hadn't been admitted at the time the group was held  Type of Therapy:  Group Therapy   Topic:  Money Matters: Ecologist, Confidence and Peace of Mind  Objective: To help participants understand the impact of financial stability on well-being through the lens of Maslow's Hierarchy of Needs and develop practical strategies for budgeting, saving, and debt repayment.  Goals: Increase awareness of spending habits and financial priorities, recognizing how money supports basic needs, security, and relationships. Develop simple budgeting and saving strategies to enhance stability and peace of mind.  Reduce financial stress by creating a realistic debt repayment plan, supporting long-term confidence and well-being.  Summary:  Participants explored how financial stability connects to basic needs, relationships, and self-esteem using Maslow's Hierarchy. They discussed budgeting, saving, and debt repayment strategies, identifying small, manageable changes. Through interactive discussion and self-reflection, they gained insight into their financial habits and created personal action steps for improvement.  Therapeutic Modalities Used: Elements of Cognitive Behavioral Therapy (CBT) - Addressing financial stress and thought patterns. Psychoeducation - Engineer, agricultural. Elements of Motivational Interviewing (MI) - Encouraging realistic, achievable changes. Group Support - Reducing shame and stress through shared experiences.   Alla Feeling, LCSWA 09/27/2023  5:43 PM

## 2023-09-27 NOTE — Progress Notes (Signed)
 Pt has been accepted to [facility] on 09/27/2023 Bed assignment: 302-1   Pt meets inpatient criteria per: Vernard Gambles NP  Attending Physician will be: Dr. Sarita Bottom, MD   Report can be called to: Adult unit: [phone]  Pt can arrive after Pending labs, vol, UDS, EKG.   Care Team Notified: [facility] Rona Ravens RN, Vernard Gambles NP, Denton Ar RN, Lorella Nimrod RN, Devinny Harrington NT   [place] Thalia Turkington LCSW-A   09/27/2023 10:10 AM

## 2023-09-27 NOTE — ED Notes (Signed)
 Safe transport here to transport to [facility]. Pt a/o, calm, cooperative, & ambulatory. Safety maintained.

## 2023-09-27 NOTE — ED Notes (Signed)
 Pt provided w/ breakfast and juice.

## 2023-09-27 NOTE — Progress Notes (Signed)
 Patient admitted to unit, oriented to unit, group room and unit schedule, patient verbalized understanding, Denies needs at this time.    09/27/23 1400  Psych Admission Type (Psych Patients Only)  Admission Status Voluntary  Psychosocial Assessment  Patient Complaints Self-harm thoughts;Self-harm behaviors;Worrying;Anxiety;Appetite decrease;Depression;Hopelessness  Eye Contact Brief  Facial Expression Flat;Sullen  Affect Anxious;Apprehensive  Speech Logical/coherent  Interaction Guarded  Motor Activity Other (Comment) (WDL)  Appearance/Hygiene Unremarkable  Behavior Characteristics Cooperative;Appropriate to situation  Mood Depressed  Thought Process  Coherency WDL  Content WDL  Delusions None reported or observed  Perception WDL  Hallucination None reported or observed  Judgment Poor  Confusion None  Danger to Self  Current suicidal ideation? Passive  Description of Suicide Plan denies plan  Agreement Not to Harm Self Yes  Description of Agreement verbal  Danger to Others  Danger to Others None reported or observed

## 2023-09-27 NOTE — ED Notes (Signed)
 Report given to [facility] at [facility].
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
## 2023-09-28 ENCOUNTER — Encounter (HOSPITAL_COMMUNITY): Payer: Self-pay

## 2023-09-28 ENCOUNTER — Encounter (HOSPITAL_COMMUNITY): Payer: Self-pay | Admitting: Psychiatry

## 2023-09-28 DIAGNOSIS — Z6981 Encounter for mental health services for victim of other abuse: Secondary | ICD-10-CM | POA: Diagnosis present

## 2023-09-28 DIAGNOSIS — T7491XA Unspecified adult maltreatment, confirmed, initial encounter: Secondary | ICD-10-CM | POA: Diagnosis present

## 2023-09-28 DIAGNOSIS — F6381 Intermittent explosive disorder: Secondary | ICD-10-CM | POA: Diagnosis present

## 2023-09-28 DIAGNOSIS — F603 Borderline personality disorder: Secondary | ICD-10-CM | POA: Diagnosis present

## 2023-09-28 DIAGNOSIS — F4310 Post-traumatic stress disorder, unspecified: Secondary | ICD-10-CM | POA: Diagnosis present

## 2023-09-28 DIAGNOSIS — F332 Major depressive disorder, recurrent severe without psychotic features: Secondary | ICD-10-CM | POA: Diagnosis not present

## 2023-09-28 LAB — FOLATE: Folate: 19.3 ng/mL (ref >5.9)

## 2023-09-28 LAB — T4, FREE: Free T4: 0.92 ng/dL (ref 0.61–1.12)

## 2023-09-28 LAB — VITAMIN B12: Vitamin B-12: 224 pg/mL (ref 180–914)

## 2023-09-28 LAB — VITAMIN D 25 HYDROXY (VIT D DEFICIENCY, FRACTURES): Vit D, 25-Hydroxy: 13.15 ng/mL — ABNORMAL LOW (ref 30–100)

## 2023-09-28 MED ORDER — DULOXETINE HCL 20 MG PO CPEP
20.0000 mg | ORAL_CAPSULE | Freq: Every day | ORAL | Status: DC
Start: 2023-09-28 — End: 2023-09-30
  Administered 2023-09-28 – 2023-09-30 (×3): 20 mg via ORAL
  Filled 2023-09-28 (×4): qty 1

## 2023-09-28 MED ORDER — LURASIDONE HCL 20 MG PO TABS
20.0000 mg | ORAL_TABLET | Freq: Every day | ORAL | Status: DC
  Filled 2023-09-28: qty 1

## 2023-09-28 MED ORDER — LURASIDONE HCL 20 MG PO TABS
20.0000 mg | ORAL_TABLET | Freq: Every day | ORAL | Status: DC
  Administered 2023-09-28 – 2023-09-29 (×2): 20 mg via ORAL
  Filled 2023-09-28 (×4): qty 1

## 2023-09-28 MED ORDER — NICOTINE POLACRILEX 2 MG MT GUM: 2.0000 mg | CHEWING_GUM | OROMUCOSAL | Status: DC | PRN

## 2023-09-28 NOTE — Group Note (Signed)
 Date:  09/28/2023 Time:  1:09 PM  Group Topic/Focus:  Goals Group:   The focus of this group is to help patients establish daily goals to achieve during treatment and discuss how the patient can incorporate goal setting into their daily lives to aide in recovery. Orientation:   The focus of this group is to educate the patient on the purpose and policies of crisis stabilization and provide a format to answer questions about their admission.  The group details unit policies and expectations of patients while admitted.    Participation Level:  Did Not Attend   Arnoldo Hooker 09/28/2023, 1:09 PM

## 2023-09-28 NOTE — Plan of Care (Signed)
   Problem: Education: Goal: Emotional status will improve Outcome: Progressing Goal: Mental status will improve Outcome: Progressing   Problem: Activity: Goal: Interest or engagement in activities will improve Outcome: Progressing Goal: Sleeping patterns will improve Outcome: Progressing   Problem: Safety: Goal: Periods of time without injury will increase Outcome: Progressing
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
## 2023-09-28 NOTE — Progress Notes (Signed)
   09/28/23 0753  Psych Admission Type (Psych Patients Only)  Admission Status Voluntary  Psychosocial Assessment  Patient Complaints Anxiety;Depression  Eye Contact Fair  Facial Expression Flat  Affect Anxious  Speech Logical/coherent  Interaction Assertive  Motor Activity Other (Comment) (WDL)  Appearance/Hygiene Unremarkable  Behavior Characteristics Cooperative  Mood Depressed  Thought Process  Coherency WDL  Content WDL  Delusions None reported or observed  Perception WDL  Hallucination None reported or observed  Judgment Poor  Confusion None  Danger to Self  Current suicidal ideation? Denies  Agreement Not to Harm Self Yes  Description of Agreement verbal  Danger to Others  Danger to Others None reported or observed

## 2023-09-28 NOTE — [facility] Group Notes (Signed)
 [facility] Group Notes:  (Nursing/MHT/Case Management/Adjunct)  Date:  09/28/2023  Time:  8:22 PM  Type of Therapy:   NA Group  Participation Level:  Active  Participation Quality:  Appropriate  Affect:  Appropriate  Cognitive:  Appropriate  Insight:  Appropriate  Engagement in Group:  Engaged  Modes of Intervention:  Education  Summary of Progress/Problems: Attended NA meeting.  Noah Delaine 09/28/2023, 8:22 PM

## 2023-09-28 NOTE — Group Note (Signed)
 Date:  09/28/2023 Time:  1:20 PM  Group Topic/Focus:  Dimensions of Wellness:   The focus of this group is to introduce the topic of physical wellness and discuss its impact on mental health.     Participation Level:  Did Not Attend   Chelsea Morse 09/28/2023, 1:20 PM
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
## 2023-09-29 DIAGNOSIS — F332 Major depressive disorder, recurrent severe without psychotic features: Secondary | ICD-10-CM | POA: Diagnosis not present

## 2023-09-29 DIAGNOSIS — E559 Vitamin D deficiency, unspecified: Secondary | ICD-10-CM | POA: Diagnosis present

## 2023-09-29 LAB — RPR: RPR Ser Ql: NONREACTIVE

## 2023-09-29 LAB — HIV ANTIBODY (ROUTINE TESTING W REFLEX): HIV Screen 4th Generation wRfx: NONREACTIVE

## 2023-09-29 LAB — T3, FREE: T3, Free: 3.5 pg/mL (ref 2.0–4.4)

## 2023-09-29 MED ORDER — VITAMIN D (ERGOCALCIFEROL) 1.25 MG (50000 UNIT) PO CAPS
50000.0000 [IU] | ORAL_CAPSULE | ORAL | Status: DC
  Administered 2023-09-29: 50000 [IU] via ORAL
  Filled 2023-09-29: qty 1

## 2023-09-29 MED ORDER — VITAMIN D3 25 MCG PO TABS
2000.0000 [IU] | ORAL_TABLET | Freq: Two times a day (BID) | ORAL | Status: DC
  Administered 2023-09-30: 2000 [IU] via ORAL
  Filled 2023-09-29 (×3): qty 2
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
## 2023-09-29 NOTE — Plan of Care (Signed)
   Problem: Education: Goal: Knowledge of [facility] Education information/materials will improve Outcome: Progressing Goal: Verbalization of understanding the information provided will improve Outcome: Progressing

## 2023-09-29 NOTE — Progress Notes (Signed)
   09/29/23 2200  Psych Admission Type (Psych Patients Only)  Admission Status Voluntary  Psychosocial Assessment  Patient Complaints Anxiety;Depression  Eye Contact Fair  Facial Expression Flat  Affect Appropriate to circumstance  Speech Logical/coherent  Interaction Assertive  Motor Activity Unsteady  Appearance/Hygiene Unremarkable  Behavior Characteristics Cooperative  Mood Depressed  Thought Process  Coherency WDL  Content WDL  Delusions None reported or observed  Perception WDL  Hallucination None reported or observed  Judgment Poor  Confusion None  Danger to Self  Current suicidal ideation? Denies  Self-Injurious Behavior No self-injurious ideation or behavior indicators observed or expressed   Agreement Not to Harm Self Yes  Description of Agreement verbal  Danger to Others  Danger to Others None reported or observed

## 2023-09-29 NOTE — Group Note (Signed)
 LCSW Group Therapy Note   Group Date: 09/29/2023 Start Time: 1100 End Time: 1200  Participation:  did not attend  Type of Therapy:  Group Therapy  Title:  Healing From Within: Understanding Our Past, Building Our Future  Objective:  To help participants understand the impact of early experiences on mental and physical health, with a focus on Adverse Childhood Experiences (ACEs), and to explore ways to build resilience and healing.  Group Goals: Understand ACEs and Their Impact: Learn how childhood experiences shape mental and physical health. Build Resilience: Develop strategies for overcoming challenges and creating positive change. Promote Healing: Recognize the value of support and the possibility of healing through therapy and self-care.  Summary: In today's session, we discussed how early experiences, especially ACEs, impact mental and physical health. We explored the effects of stress, abuse, and neglect on brain development and well-being. The group focused on resilience, understanding that healing and positive change are possible with support and self-awareness.  Therapeutic Modalities Used: Psychoeducation: Sharing information about ACEs and their effects. Cognitive Behavioral Therapy (CBT): Helping reframe negative thought patterns. Trauma-Informed Therapy: Creating a safe, supportive space for healing.   Alla Feeling, LCSWA 09/29/2023  5:50 PM

## 2023-09-29 NOTE — Progress Notes (Signed)
   09/29/23 0900  Psych Admission Type (Psych Patients Only)  Admission Status Voluntary  Psychosocial Assessment  Patient Complaints Anxiety;Depression  Eye Contact Fair  Facial Expression  (tired-groggy)  Affect Appropriate to circumstance  Speech Logical/coherent  Interaction Assertive  Motor Activity Unsteady (unsteady upon rising this am)  Appearance/Hygiene Unremarkable  Behavior Characteristics Cooperative  Mood Depressed  Thought Process  Coherency WDL  Content WDL  Delusions None reported or observed  Perception WDL  Hallucination None reported or observed  Judgment Poor  Confusion None  Danger to Self  Current suicidal ideation? Denies  Self-Injurious Behavior No self-injurious ideation or behavior indicators observed or expressed   Danger to Others  Danger to Others None reported or observed

## 2023-09-29 NOTE — Progress Notes (Signed)
   09/28/23 2200  Psych Admission Type (Psych Patients Only)  Admission Status Voluntary  Psychosocial Assessment  Patient Complaints Anxiety;Depression (Anxiety 3/10, Depression 7/10)  Eye Contact Fair  Facial Expression Flat  Affect Anxious  Speech Logical/coherent  Interaction Assertive  Motor Activity Other (Comment) (WDL)  Appearance/Hygiene Unremarkable  Behavior Characteristics Cooperative  Mood Depressed  Thought Process  Coherency WDL  Content WDL  Delusions None reported or observed  Perception WDL  Hallucination None reported or observed  Judgment Poor  Confusion None  Danger to Self  Current suicidal ideation? Denies  Self-Injurious Behavior No self-injurious ideation or behavior indicators observed or expressed   Agreement Not to Harm Self Yes  Description of Agreement Verbal  Danger to Others  Danger to Others None reported or observed

## 2023-09-29 NOTE — [facility] Group Notes (Signed)
 [facility] Group Notes:  (Nursing/MHT/Case Management/Adjunct)  Date:  09/29/2023  Time:  8:50 PM  Type of Therapy:   Wrap-up group  Participation Level:  Active  Participation Quality:  Appropriate  Affect:  Appropriate  Cognitive:  Appropriate  Insight:  Appropriate  Engagement in Group:  Engaged  Modes of Intervention:  Education  Summary of Progress/Problems: Goal to  be better than yesterday. Not met. Rated her day 5/10.  Noah Delaine 09/29/2023, 8:50 PM
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
## 2023-09-30 DIAGNOSIS — F332 Major depressive disorder, recurrent severe without psychotic features: Secondary | ICD-10-CM | POA: Diagnosis not present

## 2023-09-30 MED ORDER — NITROFURANTOIN MONOHYD MACRO 100 MG PO CAPS: 100.0000 mg | ORAL_CAPSULE | Freq: Two times a day (BID) | ORAL | 0 refills | Status: AC

## 2023-09-30 MED ORDER — DULOXETINE HCL 30 MG PO CPEP: 30.0000 mg | ORAL_CAPSULE | Freq: Every day | ORAL | 0 refills | Status: AC

## 2023-09-30 MED ORDER — CHOLECALCIFEROL 125 MCG (5000 UT) PO TABS: 1.0000 | ORAL_TABLET | Freq: Every day | ORAL | 0 refills | Status: AC

## 2023-09-30 MED ORDER — LURASIDONE HCL 20 MG PO TABS: 20.0000 mg | ORAL_TABLET | Freq: Every day | ORAL | 0 refills | Status: AC

## 2023-09-30 MED ORDER — VITAMIN D (ERGOCALCIFEROL) 1.25 MG (50000 UNIT) PO CAPS: 50000.0000 [IU] | ORAL_CAPSULE | ORAL | 0 refills | Status: AC

## 2023-09-30 NOTE — Progress Notes (Signed)
   09/30/23 0755  Psych Admission Type (Psych Patients Only)  Admission Status Voluntary  Psychosocial Assessment  Patient Complaints Anxiety  Eye Contact Fair  Facial Expression Animated  Affect Appropriate to circumstance  Speech Logical/coherent  Interaction Assertive  Motor Activity Other (Comment) (WDL)  Appearance/Hygiene Unremarkable  Behavior Characteristics Cooperative  Mood Anxious  Thought Process  Coherency WDL  Content WDL  Delusions None reported or observed  Perception WDL  Hallucination None reported or observed  Judgment WDL  Confusion None  Danger to Self  Current suicidal ideation? Denies  Agreement Not to Harm Self Yes  Description of Agreement verbal  Danger to Others  Danger to Others None reported or observed

## 2023-09-30 NOTE — BHH Suicide Risk Assessment (Signed)
 [facility] INPATIENT:  Family/Significant Other Suicide Prevention Education  Suicide Prevention Education:  Education Completed; Donnita [place], [phone], Spouse has been identified by the patient as the family member/significant other with whom the patient will be residing, and identified as the person(s) who will aid the patient in the event of a mental health crisis (suicidal ideations/suicide attempt).  With written consent from the patient, the family member/significant other has been provided the following suicide prevention education, prior to the and/or following the discharge of the patient.  The suicide prevention education provided includes the following: Suicide risk factors Suicide prevention and interventions National Suicide Hotline telephone number [facility] assessment telephone number [facility] Emergency Assistance 911 [facility] and/or Residential Mobile Crisis Unit telephone number  Request made of family/significant other to: Remove weapons (e.g., guns, rifles, knives), all items previously/currently identified as safety concern.   Remove drugs/medications (over-the-counter, prescriptions, illicit drugs), all items previously/currently identified as a safety concern.  The family member/significant other verbalizes understanding of the suicide prevention education information provided.  The family member/significant other agrees to remove the items of safety concern listed above.  Lowry Ram 09/30/2023, 10:26 AM

## 2023-09-30 NOTE — Care Management Important Message (Signed)
 Important Message  Patient Details  Name: Chelsea Morse MRN: [id number] Date of Birth: 03-26-1971   Medicare Important Message Given:  Yes - Medicare IM     Lowry Ram, LCSW 09/30/2023, 2:09 PM

## 2023-09-30 NOTE — Group Note (Signed)
 Recreation Therapy Group Note   Group Topic:Problem Solving  Group Date: 09/30/2023 Start Time: 0930 End Time: 1000 Facilitators: Chelsea Morse, LRT,CTRS Location: [address] Dayroom   Group Topic: Communication, Team Building, Problem Solving  Goal Area(s) Addresses:  Patient will effectively work with peer towards shared goal.  Patient will identify skills used to make activity successful.  Patient will share challenges and verbalize solution-driven approaches used. Patient will identify how skills used during activity can be used to reach post d/c goals.   Intervention: STEM Activity   Activity: [employer]. Patients were provided the following materials: 5 drinking straws, 5 rubber bands, 5 paper clips, 2 index cards and 2 drinking cups. Using the provided materials patients were asked to build a launching mechanism to launch a ping pong ball across the room, approximately 10 feet. Patients were divided into teams of 3-5. Instructions required all materials be incorporated into the device, functionality of items left to the peer group's discretion.  Education: Pharmacist, community, Scientist, physiological, Air cabin crew, Building control surveyor.   Education Outcome: Acknowledges education/In group clarification offered/Needs additional education.    Affect/Mood: N/A   Participation Level: Did not attend    Clinical Observations/Individualized Feedback:      Plan: Continue to engage patient in RT group sessions 2-3x/week.   Chelsea Morse, LRT,CTRS 09/30/2023 11:18 AM

## 2023-09-30 NOTE — Progress Notes (Signed)
  [facility] Adult Case Management Discharge Plan :  Will you be returning to the same living situation after discharge:  Yes,  Patient to return home.  At discharge, do you have transportation home?: Yes,  Patient's husband to provide transportation.  Do you have the ability to pay for your medications: Yes, AETNA MEDICARE / AETNA MEDICARE HMO/PPO   Release of information consent forms completed and in the chart;  Patient's signature needed at discharge.  Patient to Follow up at:  Follow-up Information     [facility] Follow up on 10/24/2023.   Specialty: Behavioral Health Why: You have an appointment for medication management services on 10/24/23 at 1:00 pm, Virtual.  You also have an appointment for individual therapy services on  10/26/23 at 1:30 pm, Virtual .  * Please call to cancel if you do not wish to attend. Contact information: [address] [place] [address] [phone]        Julienne Kass, Couples Counselor. Call.   Why: You may call this provider to schedule an in person or online appointment for couples counseling. Contact information: [address] [place], [place] [address]   P: [phone]        Trixie Deis, Couples Counselor. Call.   Why: You may call this provider to schedule an in person or online appointment for couples counseling. Contact information: [facility]                               [address] [place], [place] [address]   P: [phone]                Next level of care provider has access to [facility] Link:no  Safety Planning and Suicide Prevention discussed: Yes, Education Completed; Don[place]ita [place], [phone], Spouse has been identified by the patient as the family member/significant other with whom the patient will be residing, and identified as the person(s) who will aid the patient in the event of a mental health crisis (suicidal  ideations/suicide attempt).  With written consent from the patient, the family member/significant other has been provided the following suicide prevention education, prior to the and/or following the discharge of the patient.      Has patient been referred to the Quitline?: Patient refused referral for treatment  Patient has been referred for addiction treatment: No known substance use disorder.  Lowry Ram, LCSW 09/30/2023, 10:34 AM

## 2023-09-30 NOTE — Progress Notes (Signed)
 Pt discharged alert and oriented x 4, calm and cooperative. All belongings returned to pt. All discharge instructions reviewed with pt and pt verbalized understanding of instructions.
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
## 2023-10-24 ENCOUNTER — Encounter (HOSPITAL_COMMUNITY): Payer: Self-pay

## 2023-10-24 ENCOUNTER — Telehealth (HOSPITAL_COMMUNITY): Admitting: Psychiatry

## 2023-10-26 ENCOUNTER — Encounter (HOSPITAL_COMMUNITY): Payer: Self-pay

## 2023-10-26 ENCOUNTER — Encounter (HOSPITAL_COMMUNITY): Payer: Self-pay | Admitting: Licensed Clinical Social Worker

## 2023-10-26 ENCOUNTER — Ambulatory Visit (INDEPENDENT_AMBULATORY_CARE_PROVIDER_SITE_OTHER): Payer: Self-pay | Admitting: Licensed Clinical Social Worker

## 2023-10-26 DIAGNOSIS — Z0389 Encounter for observation for other suspected diseases and conditions ruled out: Secondary | ICD-10-CM

## 2023-10-26 NOTE — Progress Notes (Signed)
  Client no showed 

## 2023-12-14 ENCOUNTER — Ambulatory Visit (HOSPITAL_COMMUNITY)
Admission: EM | Admit: 2023-12-14 | Discharge: 2023-12-14 | Disposition: A | Discharge status: Home / Self Care | Attending: Sports Medicine | Admitting: Sports Medicine

## 2023-12-14 ENCOUNTER — Encounter (HOSPITAL_COMMUNITY): Payer: Self-pay

## 2023-12-14 ENCOUNTER — Other Ambulatory Visit (HOSPITAL_COMMUNITY): Payer: Self-pay

## 2023-12-14 ENCOUNTER — Telehealth (HOSPITAL_COMMUNITY): Payer: Self-pay

## 2023-12-14 DIAGNOSIS — J309 Allergic rhinitis, unspecified: Secondary | ICD-10-CM | POA: Diagnosis present

## 2023-12-14 DIAGNOSIS — N3001 Acute cystitis with hematuria: Secondary | ICD-10-CM

## 2023-12-14 DIAGNOSIS — R109 Unspecified abdominal pain: Secondary | ICD-10-CM

## 2023-12-14 DIAGNOSIS — U071 COVID-19: Secondary | ICD-10-CM

## 2023-12-14 DIAGNOSIS — R110 Nausea: Secondary | ICD-10-CM | POA: Diagnosis present

## 2023-12-14 LAB — POCT URINALYSIS DIP (MANUAL ENTRY)
Bilirubin, UA: NEGATIVE
Glucose, UA: NEGATIVE mg/dL
Ketones, POC UA: NEGATIVE mg/dL
Leukocytes, UA: NEGATIVE
Nitrite, UA: POSITIVE — AB
Protein Ur, POC: NEGATIVE mg/dL
Spec Grav, UA: 1.015 (ref 1.010–1.025)
Urobilinogen, UA: 0.2 U/dL
pH, UA: 6.5 (ref 5.0–8.0)

## 2023-12-14 LAB — POC SARS CORONAVIRUS 2 AG -  ED: SARS Coronavirus 2 Ag: POSITIVE — AB

## 2023-12-14 MED ORDER — DICYCLOMINE HCL 20 MG PO TABS
20.0000 mg | ORAL_TABLET | Freq: Three times a day (TID) | ORAL | 0 refills | Status: DC | PRN
Start: 2023-12-14 — End: 2023-12-14

## 2023-12-14 MED ORDER — ONDANSETRON 4 MG PO TBDP
4.0000 mg | ORAL_TABLET | Freq: Three times a day (TID) | ORAL | 0 refills | Status: DC | PRN
Start: 2023-12-14 — End: 2023-12-14

## 2023-12-14 MED ORDER — DICYCLOMINE HCL 20 MG PO TABS
20.0000 mg | ORAL_TABLET | Freq: Three times a day (TID) | ORAL | 0 refills | Status: AC | PRN
  Filled 2023-12-14: qty 20, 7d supply, fill #0

## 2023-12-14 MED ORDER — NITROFURANTOIN MONOHYD MACRO 100 MG PO CAPS
100.0000 mg | ORAL_CAPSULE | Freq: Two times a day (BID) | ORAL | 0 refills | Status: DC
Start: 2023-12-14 — End: 2023-12-14

## 2023-12-14 MED ORDER — NITROFURANTOIN MONOHYD MACRO 100 MG PO CAPS
100.0000 mg | ORAL_CAPSULE | Freq: Two times a day (BID) | ORAL | 0 refills | Status: AC
  Filled 2023-12-14: qty 10, 5d supply, fill #0

## 2023-12-14 MED ORDER — CETIRIZINE HCL 10 MG PO TABS
10.0000 mg | ORAL_TABLET | Freq: Every day | ORAL | 0 refills | Status: AC
  Filled 2023-12-14: qty 14, 14d supply, fill #0

## 2023-12-14 MED ORDER — CETIRIZINE HCL 10 MG PO TABS: 10.0000 mg | ORAL_TABLET | Freq: Every day | ORAL | 0 refills | Status: DC

## 2023-12-14 MED ORDER — ONDANSETRON 4 MG PO TBDP
4.0000 mg | ORAL_TABLET | Freq: Three times a day (TID) | ORAL | 0 refills | Status: AC | PRN
  Filled 2023-12-14: qty 20, 7d supply, fill #0

## 2023-12-14 NOTE — ED Triage Notes (Signed)
 Pt states has a strong odor in her urine for a few months. States seen and tx'd for a UTI in February.   Pt c/o cough, scratchy throat, and sneezing since yesterday.

## 2023-12-14 NOTE — Discharge Instructions (Addendum)
 You were seen today for symptoms consistent with a urinary tract infection. Your urine test showed signs of infection, and you were started on an antibiotic. A urine culture was sent to help guide treatment if needed. Please take all medications as prescribed and stay well hydrated.   You also tested positive for COVID-19. The CDC has updated his guidelines that state that people who have tested positive for COVID-19 no longer need to isolate if symptoms are mild and there is no fever. You may return to normal activities if your symptoms are overall improving and you have been without a fever for 24 hours without taking fever reducing medications like Tylenol  or ibuprofen. I have prescribed cetirizine for you to take daily for your allergy symptoms. Over-the-counter cough suppressants may help with your upper respiratory symptoms.   You also reported abdominal pain and ongoing gastrointestinal issues, including difficulty controlling bowel movements. While there is no urgent concern at this time, follow-up with your primary care provider is recommended for further evaluation, including a possible repeat colonoscopy. I have prescribed you a medication called bentyl  that you can take as needed for abdominal cramping and a medication called Zofran  that you can take as needed for nausea. Please go to the emergency department if you develop a fever over 101F, worsening abdominal pain, persistent nausea or vomiting, back or flank pain, blood in your urine or stool, difficulty breathing, chest pain, signs of dehydration such as dizziness or very low urine output, confusion, or worsening symptoms related to COVID-19. If you are unsure, it is best to seek medical attention promptly.
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
## 2023-12-16 ENCOUNTER — Encounter: Payer: Self-pay | Admitting: Student

## 2023-12-16 ENCOUNTER — Ambulatory Visit (HOSPITAL_COMMUNITY): Payer: Self-pay

## 2023-12-16 LAB — URINE CULTURE: Culture: >=100000 — AB

## 2023-12-16 MED ORDER — SULFAMETHOXAZOLE-TRIMETHOPRIM 800-160 MG PO TABS: 1.0000 | ORAL_TABLET | Freq: Two times a day (BID) | ORAL | 0 refills | Status: AC

## 2023-12-26 ENCOUNTER — Other Ambulatory Visit (HOSPITAL_COMMUNITY): Payer: Self-pay

## 2024-01-03 ENCOUNTER — Emergency Department (HOSPITAL_COMMUNITY)
Admission: EM | Admit: 2024-01-03 | Discharge: 2024-01-04 | Disposition: A | Discharge status: Home / Self Care | Attending: Emergency Medicine | Admitting: Emergency Medicine

## 2024-01-03 ENCOUNTER — Emergency Department (HOSPITAL_COMMUNITY)

## 2024-01-03 ENCOUNTER — Other Ambulatory Visit: Payer: Self-pay

## 2024-01-03 DIAGNOSIS — M5136 Other intervertebral disc degeneration, lumbar region with discogenic back pain only: Secondary | ICD-10-CM | POA: Insufficient documentation

## 2024-01-03 DIAGNOSIS — M545 Low back pain, unspecified: Secondary | ICD-10-CM

## 2024-01-03 DIAGNOSIS — N83202 Unspecified ovarian cyst, left side: Secondary | ICD-10-CM | POA: Insufficient documentation

## 2024-01-03 NOTE — ED Triage Notes (Signed)
 Patient reports left lower back pain injured this morning at work while assisting a patient to transfer.

## 2024-01-03 NOTE — ED Provider Triage Note (Signed)
 Emergency Medicine Provider Triage Evaluation Note  [name] , a 53 y.o. female  was evaluated in triage.  Pt complains of low back pain that started this morning after trying to keep a patient from falling out of bed.  She states she has history of back issues and this exacerbated it.  Review of Systems  Positive: As above Negative: As above  Physical Exam  BP (!) 137/100 (BP Location: Right Arm)   Pulse (!) 57   Temp 97.7 F (36.5 C) (Oral)   Resp 16   SpO2 100%  Gen:   Awake, no distress   Resp:  Normal effort  MSK:   Moves extremities without difficulty  Other:  Midline tenderness over the lumbar spine  Medical Decision Making  Medically screening exam initiated at 7:30 PM.  Appropriate orders placed.  Skylan Nettie Wyffels was informed that the remainder of the evaluation will be completed by another provider, this initial triage assessment does not replace that evaluation, and the importance of remaining in the ED until their evaluation is complete.     Hildegard Loge, PA-C 01/03/24 1931

## 2024-01-04 ENCOUNTER — Ambulatory Visit: Admitting: Student

## 2024-01-04 MED ORDER — PREDNISONE 20 MG PO TABS: 40.0000 mg | ORAL_TABLET | Freq: Every day | ORAL | 0 refills | Status: AC

## 2024-01-04 MED ORDER — CYCLOBENZAPRINE HCL 10 MG PO TABS
10.0000 mg | ORAL_TABLET | Freq: Three times a day (TID) | ORAL | 0 refills | Status: AC | PRN
Start: 2024-01-04 — End: ?

## 2024-01-04 MED ORDER — OXYCODONE-ACETAMINOPHEN 5-325 MG PO TABS
2.0000 | ORAL_TABLET | Freq: Once | ORAL | Status: AC
  Administered 2024-01-04: 2 via ORAL
  Filled 2024-01-04: qty 2

## 2024-01-04 NOTE — ED Provider Notes (Signed)
 [facility] Emergency Department Provider Note MRN:  [id number]  Arrival date & time: 01/04/24     Chief Complaint   Back Pain   History of Present Illness   Chelsea Morse is a 53 y.o. year-old female presents to the ED with chief complaint of low back pain.  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  She states that she felt a strain and subsequent pain in her low back and in her left upper trapezius.  She states that she has long hx of degenerative disc disease and has herniated discs before.  Denies any treatments PTA.  She states that the pain is worsened with movement, bending, walking, and palpation.  History provided by patient.   Review of Systems  Pertinent positive and negative review of systems noted in HPI.    Physical Exam   Vitals:   01/03/24 2301 01/04/24 0248  BP: 134/79 (!) 171/113  Pulse: (!) 51 63  Resp: 16 16  Temp: 97.9 F (36.6 C) 97.6 F (36.4 C)  SpO2: 100% 100%    CONSTITUTIONAL:  uncomfortable-appearing, NAD NEURO:  Alert and oriented x 3, CN 3-12 grossly intact EYES:  eyes equal and reactive ENT/NECK:  Supple, no stridor  CARDIO:  normal rate, regular rhythm, appears well-perfused  PULM:  No respiratory distress, CTAB GI/GU:  non-distended,  MSK/SPINE:  No gross deformities, no edema, moves all extremities, left lumbar paraspinal muscle tenderness, no step-off or deformity, left upper trapezius muscle tenderness  SKIN:  no rash, , atraumatic   *Additional and/or pertinent findings included in MDM below  Diagnostic and Interventional Summary    EKG Interpretation Date/Time:    Ventricular Rate:    PR Interval:    QRS Duration:    QT Interval:    QTC Calculation:   R Axis:      Text Interpretation:         Labs Reviewed - No data to display  CT Lumbar Spine Wo Contrast  Final Result  Addendum (preliminary) 1 of 1   ADDENDUM REPORT: 01/03/2024 20:31    ADDENDUM:  Additional impression    5.6 cm left ovarian cyst. Recommend follow-up US  in 6-12 months.  Note: This recommendation does not apply to premenarchal patients  and to those with increased risk (genetic, family history, elevated  tumor markers or other high-risk factors) of ovarian cancer.  Reference: JACR 2020 Feb; 17(2):248-254      Electronically Signed    By: Franky Crease M.D.    On: 01/03/2024 20:31      Final      Medications  oxyCODONE -acetaminophen  (PERCOCET/ROXICET) 5-325 MG per tablet 2 tablet (has no administration in time range)     Procedures  /  Critical Care Procedures  ED Course and Medical Decision Making  I have reviewed the triage vital signs, the nursing notes, and pertinent available records from the EMR.  Social Determinants Affecting Complexity of Care: Patient is homelessness.   ED Course:    Medical Decision Making Patient with back pain.    No neurological deficits and normal neuro exam.  Patient is ambulatory.  No loss of bowel or bladder control.  Doubt cauda equina.  Denies fever,  doubt epidural abscess or other lesion. Recommend back exercises, stretching, RICE, and will treat with a short course of prednisone  and flexeril.  Consultants: none  Review of prior charts show no recent  visits for the same.  Encouraged the patient that there could be a need for additional workup and/or imaging such as MRI, if the symptoms do not resolve. Patient advised that if the back pain does not resolve, or radiates, this could progress to more serious conditions and is encouraged to follow-up with PCP or orthopedics within 2 weeks.    We discussed her CT findings and follow-up instructions.   Risk Prescription drug management.         Consultants: No consultations were needed in caring for this patient.   Treatment and Plan: Emergency department workup does not suggest an emergent condition  requiring admission or immediate intervention beyond  what has been performed at this time. The patient is safe for discharge and has  been instructed to return immediately for worsening symptoms, change in  symptoms or any other concerns    Final Clinical Impressions(s) / ED Diagnoses     ICD-10-CM   1. Acute midline low back pain without sciatica  M54.50     2. Degeneration of intervertebral disc of lumbar region with discogenic back pain  M51.360     3. Cyst of left ovary  N83.202       ED Discharge Orders          Ordered    predniSONE  (DELTASONE ) 20 MG tablet  Daily        01/04/24 0301    cyclobenzaprine (FLEXERIL) 10 MG tablet  3 times daily PRN        01/04/24 0301              Discharge Instructions Discussed with and Provided to Patient:   Discharge Instructions   None      Vicky Charleston, PA-C 01/04/24 0305    Griselda Norris, MD 01/04/24 [phone]

## 2024-01-06 ENCOUNTER — Ambulatory Visit: Payer: Self-pay

## 2024-01-06 NOTE — Telephone Encounter (Signed)
 See Triage Notes From Triage Nurse.  FYI Patient is establish care as a new patient on 01/26/2024 with Greig Cluster, NP    Message sent to Greig Cluster, NP

## 2024-01-06 NOTE — Telephone Encounter (Signed)
 FYI Only or Action Required?: FYI only for provider.  Patient was last seen in primary care on NA. Called Nurse Triage reporting Back Pain. Symptoms began several days ago. Interventions attempted: Prescription medications: prednisone . Symptoms are: unchanged.  Triage Disposition: See PCP When Office is Open (Within 3 Days) New patient apt. In July scheduled. Was seen in ER.  Patient/caregiver understands and will follow disposition?: Yes    Copied from CRM [id number]. Topic: Clinical - Red Word Triage >> Jan 06, 2024  9:27 AM [place] B wrote: Pt has severe back pain, had injury at work Reason for Disposition  [1] MODERATE back pain (e.g., interferes with normal activities) AND [2] present > 3 days  Answer Assessment - Initial Assessment Questions 1. ONSET: When did the pain begin?      Tue, 01/03/24; was a work and patient was sliding out of the chair and used body to protect patient and strained back; did go to the ER and had CT scan 2. LOCATION: Where does it hurt? (upper, mid or lower back)     lower 3. SEVERITY: How bad is the pain?  (e.g., Scale 1-10; mild, moderate, or severe)   - MILD (1-3): Doesn't interfere with normal activities.    - MODERATE (4-7): Interferes with normal activities or awakens from sleep.    - SEVERE (8-10): Excruciating pain, unable to do any normal activities.      moderate 4. PATTERN: Is the pain constant? (e.g., yes, no; constant, intermittent)      constant 5. RADIATION: Does the pain shoot into your legs or somewhere else?     no 6. CAUSE:  What do you think is causing the back pain?      injury 7. BACK OVERUSE:  Any recent lifting of heavy objects, strenuous work or exercise?     Yes, at work 8. MEDICINES: What have you taken so far for the pain? (e.g., nothing, acetaminophen , NSAIDS)     Prednisone   9. NEUROLOGIC SYMPTOMS: Do you have any weakness, numbness, or problems with bowel/bladder control?     no 10. OTHER SYMPTOMS: Do  you have any other symptoms? (e.g., fever, abdomen pain, burning with urination, blood in urine)       no 11. PREGNANCY: Is there any chance you are pregnant? When was your last menstrual period?       no  Protocols used: Back Pain-A-AH

## 2024-01-06 NOTE — Telephone Encounter (Signed)
 Patient agree and had no other questions or concerns at this time  Message sent to Greig Cluster, NP

## 2024-01-06 NOTE — Telephone Encounter (Signed)
 I have reviewed ED visit. Recommend going to [facility] urgent care at [facility] if no improvement in back pain. The urgent care welcomes walk in's. They have spine specialists within practice that can help with pain management and treatment of bulging discs.

## 2024-01-26 ENCOUNTER — Encounter: Admitting: Orthopedic Surgery

## 2024-01-27 NOTE — Progress Notes (Signed)
 This encounter was created in error - please disregard.
